# Patient Record
Sex: Male | Born: 1985 | Race: White | Hispanic: No | Marital: Married | Smoking: Never smoker
Health system: Southern US, Community
[De-identification: ages and names within clinical notes are randomized; demographics above are authoritative.]

## PROBLEM LIST (undated history)

## (undated) DIAGNOSIS — I739 Peripheral vascular disease, unspecified: Secondary | ICD-10-CM

## (undated) DIAGNOSIS — F341 Dysthymic disorder: Secondary | ICD-10-CM

## (undated) DIAGNOSIS — F419 Anxiety disorder, unspecified: Secondary | ICD-10-CM

## (undated) DIAGNOSIS — F909 Attention-deficit hyperactivity disorder, unspecified type: Secondary | ICD-10-CM

## (undated) DIAGNOSIS — K219 Gastro-esophageal reflux disease without esophagitis: Secondary | ICD-10-CM

## (undated) DIAGNOSIS — I10 Essential (primary) hypertension: Secondary | ICD-10-CM

## (undated) DIAGNOSIS — G4730 Sleep apnea, unspecified: Secondary | ICD-10-CM

## (undated) DIAGNOSIS — E119 Type 2 diabetes mellitus without complications: Secondary | ICD-10-CM

## (undated) DIAGNOSIS — I499 Cardiac arrhythmia, unspecified: Secondary | ICD-10-CM

## (undated) HISTORY — DX: Dysthymic disorder: F34.1

## (undated) HISTORY — PX: WISDOM TOOTH EXTRACTION: SHX21

## (undated) HISTORY — DX: Gastro-esophageal reflux disease without esophagitis: K21.9

---

## 2004-05-30 ENCOUNTER — Emergency Department (HOSPITAL_COMMUNITY): Admission: EM | Admit: 2004-05-30 | Discharge: 2004-05-30 | Payer: Self-pay | Admitting: Family Medicine

## 2006-08-23 ENCOUNTER — Ambulatory Visit: Payer: Self-pay | Admitting: Family Medicine

## 2006-09-05 ENCOUNTER — Ambulatory Visit: Payer: Self-pay | Admitting: Family Medicine

## 2006-10-04 ENCOUNTER — Ambulatory Visit: Payer: Self-pay | Admitting: Family Medicine

## 2007-05-13 ENCOUNTER — Ambulatory Visit: Payer: Self-pay | Admitting: Family Medicine

## 2007-05-22 ENCOUNTER — Ambulatory Visit: Payer: Self-pay | Admitting: Family Medicine

## 2007-05-26 ENCOUNTER — Ambulatory Visit: Payer: Self-pay | Admitting: Family Medicine

## 2007-12-31 ENCOUNTER — Ambulatory Visit: Payer: Self-pay | Admitting: Family Medicine

## 2008-04-23 ENCOUNTER — Ambulatory Visit: Payer: Self-pay | Admitting: Family Medicine

## 2008-10-22 ENCOUNTER — Ambulatory Visit: Payer: Self-pay | Admitting: Family Medicine

## 2008-12-31 ENCOUNTER — Ambulatory Visit: Payer: Self-pay | Admitting: Family Medicine

## 2009-02-22 ENCOUNTER — Ambulatory Visit: Payer: Self-pay | Admitting: Family Medicine

## 2009-04-29 ENCOUNTER — Ambulatory Visit: Payer: Self-pay | Admitting: Family Medicine

## 2009-06-03 ENCOUNTER — Ambulatory Visit: Payer: Self-pay | Admitting: Family Medicine

## 2009-11-09 ENCOUNTER — Encounter: Admission: RE | Admit: 2009-11-09 | Discharge: 2009-11-09 | Payer: Self-pay | Admitting: Family Medicine

## 2009-11-09 ENCOUNTER — Ambulatory Visit: Payer: Self-pay | Admitting: Family Medicine

## 2010-05-14 ENCOUNTER — Emergency Department (HOSPITAL_COMMUNITY): Admission: EM | Admit: 2010-05-14 | Discharge: 2010-05-15 | Payer: Self-pay | Admitting: Emergency Medicine

## 2010-05-15 ENCOUNTER — Ambulatory Visit: Payer: Self-pay | Admitting: Family Medicine

## 2011-03-08 ENCOUNTER — Emergency Department (HOSPITAL_COMMUNITY)
Admission: EM | Admit: 2011-03-08 | Discharge: 2011-03-08 | Disposition: A | Discharge status: Home / Self Care | Payer: Managed Care, Other (non HMO) | Attending: Emergency Medicine | Admitting: Emergency Medicine

## 2011-03-08 DIAGNOSIS — I10 Essential (primary) hypertension: Secondary | ICD-10-CM | POA: Insufficient documentation

## 2011-03-08 DIAGNOSIS — H6690 Otitis media, unspecified, unspecified ear: Secondary | ICD-10-CM | POA: Insufficient documentation

## 2011-03-09 ENCOUNTER — Ambulatory Visit (INDEPENDENT_AMBULATORY_CARE_PROVIDER_SITE_OTHER): Payer: Managed Care, Other (non HMO) | Admitting: Family Medicine

## 2011-03-09 DIAGNOSIS — H6690 Otitis media, unspecified, unspecified ear: Secondary | ICD-10-CM

## 2011-05-11 ENCOUNTER — Telehealth: Payer: Self-pay | Admitting: Family Medicine

## 2011-05-11 ENCOUNTER — Other Ambulatory Visit: Payer: Self-pay | Admitting: Family Medicine

## 2011-05-11 NOTE — Telephone Encounter (Signed)
No samples available 

## 2011-05-11 NOTE — Telephone Encounter (Signed)
jcl said we have no samples had to leave a message on phone of this

## 2011-05-11 NOTE — Telephone Encounter (Signed)
Called [facility] [address] advair 250/50 1 bid #1 with 6 rf

## 2011-06-10 ENCOUNTER — Other Ambulatory Visit: Payer: Self-pay | Admitting: Family Medicine

## 2011-06-10 MED ORDER — ALBUTEROL SULFATE HFA 108 (90 BASE) MCG/ACT IN AERS: 2.0000 | INHALATION_SPRAY | Freq: Four times a day (QID) | RESPIRATORY_TRACT | Status: DC | PRN

## 2011-06-10 NOTE — Telephone Encounter (Signed)
[facility], saying that [facility] was at pharmacy, stating he was having an asthma attack and requesting a refill on his ventolin inhaler.  Pharmacist stated that it didn't appear that patient was in any respiratory distress, and that last refill of Ventolin was in November.  I authorized refill x 1, and told him to advise pt to schedule appointment if breathing problems persist

## 2011-08-13 ENCOUNTER — Ambulatory Visit (INDEPENDENT_AMBULATORY_CARE_PROVIDER_SITE_OTHER): Payer: 59 | Admitting: Family Medicine

## 2011-08-13 ENCOUNTER — Encounter: Payer: Self-pay | Admitting: Family Medicine

## 2011-08-13 VITALS — BP 118/76 | HR 80 | Temp 98.2°F | Ht 72.0 in | Wt 188.0 lb

## 2011-08-13 DIAGNOSIS — K529 Noninfective gastroenteritis and colitis, unspecified: Secondary | ICD-10-CM

## 2011-08-13 DIAGNOSIS — R112 Nausea with vomiting, unspecified: Secondary | ICD-10-CM

## 2011-08-13 DIAGNOSIS — K5289 Other specified noninfective gastroenteritis and colitis: Secondary | ICD-10-CM

## 2011-08-13 MED ORDER — ONDANSETRON 8 MG PO TBDP: 8.0000 mg | ORAL_TABLET | Freq: Three times a day (TID) | ORAL | Status: AC | PRN

## 2011-08-13 NOTE — Patient Instructions (Signed)
Drink fluids--but do it SLOWLY Imodium if needed for diarrhea Avoid dairy Bland foods Return immediately for medical evaluation if high fevers, worsening pain, blood in vomit or stool, or other new concerns

## 2011-08-13 NOTE — Progress Notes (Signed)
Patient presents with complaint of nausea and vomiting since this am, has vomited 4-5 times since this am. Sweats and diarrhea.   Slight headache when he went to bad last night, otherwise felt fine yesterday.  Woke today with stomach doing flips, dry heaving and vomiting.  Still went to work.  Took one of his fiancee's orally disintegrating nausea med (likely zofran) and 3-4 hours later started vomiting again. Left work at 11:45, sipped on some gatorade (drank bottle in 20 minutes, and vomited it all up 15 minutes later).  +sweats at work, but felt cold.  Had been slightly constipated over the last day or so, but since arriving to this office, had an episode of diarrhea, loose and watery.  Nonbloody.  No hematesis.  Steffanie Rainwater had some nausea and vomiting 2 weeks ago--unclear if virus or related to some bladder medications.  No fevers, but some diarrhea, lasted 48 hours.  Has another friend with nausea/vomiting. Has kept down bottled water this afternoon.  No recent ABX; no camping or traveling, undercooked foods  Past Medical History  Diagnosis Date  . Asthma   . Allergic rhinitis   . GERD (gastroesophageal reflux disease)   . Dysthymia    Past Surgical History  Procedure Date  . Wisdom tooth extraction    History   Social History  . Marital Status: Single    Spouse Name: N/A    Number of Children: N/A  . Years of Education: N/A   Occupational History  . Curator    Social History Main Topics  . Smoking status: Never Smoker   . Smokeless tobacco: Never Used  . Alcohol Use: Yes     6 beers per month  . Drug Use: No  . Sexually Active: Not on file   Other Topics Concern  . Not on file   Social History Narrative   Engaged   ROS:  Denies fever, URI symptoms, cough, SOB, skin rash or other concerns. See HPI  PHYSICAL EXAM: BP 118/76  Pulse 80  Temp(Src) 98.2 F (36.8 C) (Oral)  Ht 6' (1.829 m)  Wt 188 lb (85.276 kg)  BMI 25.50 kg/m2 Well developed male, in no  distress HEENT: Sclera anicteric.  OP--moist mucus membranes Neck: no lymphadenopathy, or mass Heart: regular rate and rhythm without murmur Lungs: clear bilaterally Abdomen:  Normal bowel sounds.  Very mild epigastric and RUQ tenderness. No rebound or guarding.  No organomegaly or mass Extremities: no edema Skin: no rash, normal turgor  ASSESSMENT/PLAN: 1. Nausea & vomiting  ondansetron (ZOFRAN ODT) 8 MG disintegrating tablet  2. Gastroenteritis     Risks/side effects of zofran reviewed.  Gentle hydration.  Clear diet, advance as tolerated.  Avoid dairy given recent onset of diarrhea.  May use imodium prn diarrhea  Discussed signs and symptoms of dehydration--to go to ER for IV hydration if this develops.  Also to ER if worsening pain, fever, hematemesis, bloody stools, or other worsening symptoms

## 2011-12-24 ENCOUNTER — Encounter: Payer: Self-pay | Admitting: Medical

## 2011-12-24 ENCOUNTER — Ambulatory Visit (INDEPENDENT_AMBULATORY_CARE_PROVIDER_SITE_OTHER): Payer: 59 | Admitting: Medical

## 2011-12-24 VITALS — BP 120/90 | HR 68 | Temp 97.7°F | Resp 12 | Ht 72.0 in | Wt 199.0 lb

## 2011-12-24 DIAGNOSIS — J45909 Unspecified asthma, uncomplicated: Secondary | ICD-10-CM

## 2011-12-24 DIAGNOSIS — K219 Gastro-esophageal reflux disease without esophagitis: Secondary | ICD-10-CM

## 2011-12-24 MED ORDER — LEVOFLOXACIN 500 MG PO TABS: 500.0000 mg | ORAL_TABLET | Freq: Every day | ORAL | Status: DC

## 2011-12-24 MED ORDER — ALBUTEROL SULFATE HFA 108 (90 BASE) MCG/ACT IN AERS: 2.0000 | INHALATION_SPRAY | Freq: Four times a day (QID) | RESPIRATORY_TRACT | Status: DC | PRN

## 2011-12-24 MED ORDER — LANSOPRAZOLE 30 MG PO CPDR: DELAYED_RELEASE_CAPSULE | ORAL | Status: DC

## 2011-12-24 NOTE — Patient Instructions (Signed)
Gastroesophageal Reflux Disease, Adult Gastroesophageal reflux disease (GERD) happens when acid from your stomach flows up into the esophagus. When acid comes in contact with the esophagus, the acid causes soreness (inflammation) in the esophagus. Over time, GERD may create small holes (ulcers) in the lining of the esophagus. CAUSES   Increased body weight. This puts pressure on the stomach, making acid rise from the stomach into the esophagus.   Smoking. This increases acid production in the stomach.   Drinking alcohol. This causes decreased pressure in the lower esophageal sphincter (valve or ring of muscle between the esophagus and stomach), allowing acid from the stomach into the esophagus.   Late evening meals and a full stomach. This increases pressure and acid production in the stomach.   A malformed lower esophageal sphincter.  Sometimes, no cause is found. SYMPTOMS   Burning pain in the lower part of the mid-chest behind the breastbone and in the mid-stomach area. This may occur twice a week or more often.   Trouble swallowing.   Sore throat.   Dry cough.   Asthma-like symptoms including chest tightness, shortness of breath, or wheezing.  DIAGNOSIS  Your caregiver may be able to diagnose GERD based on your symptoms. In some cases, X-rays and other tests may be done to check for complications or to check the condition of your stomach and esophagus. TREATMENT  Your caregiver may recommend over-the-counter or prescription medicines to help decrease acid production. Ask your caregiver before starting or adding any new medicines.  HOME CARE INSTRUCTIONS   Change the factors that you can control. Ask your caregiver for guidance concerning weight loss, quitting smoking, and alcohol consumption.   Avoid foods and drinks that make your symptoms worse, such as:   Caffeine or alcoholic drinks.   Chocolate.   Peppermint or mint flavorings.   Garlic and onions.   Spicy foods.     Citrus fruits, such as oranges, lemons, or limes.   Tomato-based foods such as sauce, chili, salsa, and pizza.   Fried and fatty foods.   Avoid lying down for the 3 hours prior to your bedtime or prior to taking a nap.   Eat small, frequent meals instead of large meals.   Wear loose-fitting clothing. Do not wear anything tight around your waist that causes pressure on your stomach.   Raise the head of your bed 6 to 8 inches with wood blocks to help you sleep. Extra pillows will not help.   Only take over-the-counter or prescription medicines for pain, discomfort, or fever as directed by your caregiver.   Do not take aspirin, ibuprofen, or other nonsteroidal anti-inflammatory drugs (NSAIDs).  SEEK IMMEDIATE MEDICAL CARE IF:   You have pain in your arms, neck, jaw, teeth, or back.   Your pain increases or changes in intensity or duration.   You develop nausea, vomiting, or sweating (diaphoresis).   You develop shortness of breath, or you faint.   Your vomit is green, yellow, black, or looks like coffee grounds or blood.   Your stool is red, bloody, or black.  These symptoms could be signs of other problems, such as heart disease, gastric bleeding, or esophageal bleeding. MAKE SURE YOU:   Understand these instructions.   Will watch your condition.   Will get help right away if you are not doing well or get worse.  Document Released: 09/19/2005 Document Revised: 08/22/2011 Document Reviewed: 06/29/2011 [facility] Patient Information 2012 [place], [place].    Acute Bronchitis Bronchitis is a problem  of the air tubes leading to your lungs. Acute means the illness started quickly. In this condition, the lining of those tubes becomes puffy (swollen) and can leak fluid. This makes it harder for air to get in and out of your lungs. You may cough a lot. This is because the air tubes are narrow. Bronchitis is most often caused by a virus. Medicines that kill germs (antibiotics) may be  needed with germ (bacteria) infections for people who:  Smoke.   Have lasting (chronic) lung problems.   Are elderly.  HOME CARE  Rest.   Drink enough water and fluids to keep the pee clear or pale yellow.   Only take medicine as told by your doctor.   Medicines may be prescribed that will open up the airways. This will help make breathing easier.   Bronchitis usually gets better on its own in a few days.  Recovery from some problems (symptoms) of bronchitis may be slow. You should start feeling a little better after 2 to 3 days. Coughing may last for 3 to 4 weeks. GET HELP RIGHT AWAY IF:  You or your child has a temperature by mouth above 101, not controlled by medicine.   Chills or chest pain develops.   You or your child develops very bad shortness of breath.   There is bloody saliva mixed with mucus (sputum).   You or your child throws up (vomits) often, loses too much fluid (dehydration), feels faint, or has a very bad headache.   You or your child does not improve after 1 week of treatment.  MAKE SURE YOU:   Understand these instructions.   Will watch this condition.   Will get help right away if you or your child is not doing well or gets worse.  Document Released: 05/28/2008 Document Re-Released: 03/06/2010 [facility] Patient Information 2011 [place], [place].

## 2011-12-24 NOTE — Progress Notes (Signed)
Subjective:  Here today for multiple c/o.   He notes random bad coughing spells that last over a minute.  Starts as heart burn, gets tickle in back of throat, and then goes to coughing.  Within 15 minutes of eating, gets these bad coughing spells also, and will usually vomit up his last meal.  Lately been having constant heartburn.  Voice is raspy.  Diagnosed with GERD years ago by ENT, was put on Nexium at that time.  At some point ran out of the medication.   Over time though he has used OTC Prilosec with relief.  Last few weeks been using Prilosec and this has helped.  Lately getting coughing spells first thing in the morning. Father has GERD.  PGM with intestinal cancer but no other GI cancers in family.  Thinks he has a respiratory infection on top of the cough issues.  He notes sinus pressure, mucous from nose and productive sputum x 5 days.  Feels rattly in chest.  Denies fever, ear pain, but has had some sore throat.  He is a nonsmoker.   Past Medical History  Diagnosis Date  . Asthma   . Allergic rhinitis   . GERD (gastroesophageal reflux disease)   . Dysthymia       Objective:   Physical Exam  Filed Vitals:   12/24/11 0823  BP: 120/90  Pulse: 68  Temp: 97.7 F (36.5 C)    General appearance: alert, no distress, WD/WN, white male HEENT: normocephalic, sclerae anicteric, TMs pearly, nares with clear discharge mild erythema, pharynx normal Oral cavity: MMM, no lesions Neck: supple, no lymphadenopathy, no thyromegaly, no masses Heart: RRR, normal S1, S2, no murmurs Lungs: CTA bilaterally, no wheezes, rhonchi, or rales Abdomen: +bs, soft, non tender, non distended, no masses, no hepatomegaly, no splenomegaly Pulses: 2+ symmetric, upper and lower extremities, normal cap refill   Assessment and Plan :    Encounter Diagnoses  Name Primary?  Marland Kitchen Asthmatic bronchitis Yes  . GERD (gastroesophageal reflux disease)    Asthmatic bronchitis-prescription today for Levaquin,  hydrate well, can use over-the-counter cough and decongestant medication  GERD- stop omeprazole, begin Prevacid. Advised he avoid reflux triggers, recheck in one month.

## 2012-01-02 ENCOUNTER — Other Ambulatory Visit: Payer: Self-pay | Admitting: Family Medicine

## 2012-01-07 ENCOUNTER — Telehealth: Payer: Self-pay | Admitting: Medical

## 2012-01-07 NOTE — Telephone Encounter (Signed)
OV recheck to discuss options and other testing

## 2012-01-10 ENCOUNTER — Ambulatory Visit (INDEPENDENT_AMBULATORY_CARE_PROVIDER_SITE_OTHER): Payer: 59 | Admitting: Family Medicine

## 2012-01-10 VITALS — BP 120/80 | HR 85 | Temp 98.2°F | Ht 72.0 in | Wt 193.0 lb

## 2012-01-10 DIAGNOSIS — J209 Acute bronchitis, unspecified: Secondary | ICD-10-CM

## 2012-01-10 MED ORDER — LEVOFLOXACIN 500 MG PO TABS: 500.0000 mg | ORAL_TABLET | Freq: Every day | ORAL | Status: AC

## 2012-01-10 NOTE — Patient Instructions (Signed)
Use Robitussin-DM during the day and NyQuil at night. If you're not totally back to normal when he finish the antibiotics, call me.

## 2012-01-10 NOTE — Progress Notes (Signed)
  Subjective:    Patient ID: Ronald Anderson, male    DOB: 1986-11-13, 26 y.o.   MRN: [id number]  HPI He is here for recheck. He does state he got roughly 75% better after the last round of antibiotics but the productive cough is starting to get worse. He has had no fever, sore throat or earache.   Review of Systems     Objective:   Physical Exam alert and in no distress. Tympanic membranes and canals are normal. Throat is clear. Tonsils are normal. Neck is supple without adenopathy or thyromegaly. Cardiac exam shows a regular sinus rhythm without murmurs or gallops. Lungs are clear to auscultation.        Assessment & Plan:   1. Bronchitis, acute    I will give him Levaquin again. He is to call me at the end of the course of antibiotic if not entirely better.

## 2012-03-31 ENCOUNTER — Telehealth: Payer: Self-pay | Admitting: Internal Medicine

## 2012-03-31 MED ORDER — ALBUTEROL SULFATE HFA 108 (90 BASE) MCG/ACT IN AERS: 2.0000 | INHALATION_SPRAY | Freq: Four times a day (QID) | RESPIRATORY_TRACT | Status: DC | PRN

## 2012-03-31 NOTE — Telephone Encounter (Signed)
Albuterol renewed

## 2012-07-06 ENCOUNTER — Other Ambulatory Visit: Payer: Self-pay | Admitting: Family Medicine

## 2012-09-26 ENCOUNTER — Other Ambulatory Visit: Payer: Self-pay | Admitting: Family Medicine

## 2013-01-27 ENCOUNTER — Other Ambulatory Visit: Payer: Self-pay | Admitting: Family Medicine

## 2013-03-09 ENCOUNTER — Telehealth: Payer: Self-pay | Admitting: Family Medicine

## 2013-03-09 NOTE — Telephone Encounter (Signed)
PT CALLED REQUESTING REFILL ON ADVAIR SENT INTO [facility] [address]

## 2013-03-09 NOTE — Telephone Encounter (Signed)
PT INFORMED 01/27/13 PT HAD MED SENT IN WITH 5 REFILLS

## 2013-04-13 ENCOUNTER — Other Ambulatory Visit: Payer: Self-pay | Admitting: Family Medicine

## 2013-07-23 ENCOUNTER — Other Ambulatory Visit: Payer: Self-pay | Admitting: Family Medicine

## 2013-08-11 ENCOUNTER — Encounter: Payer: Self-pay | Admitting: Family Medicine

## 2013-08-11 ENCOUNTER — Ambulatory Visit (INDEPENDENT_AMBULATORY_CARE_PROVIDER_SITE_OTHER): Payer: 59 | Admitting: Family Medicine

## 2013-08-11 VITALS — BP 114/70 | HR 90 | Temp 97.8°F | Wt 204.0 lb

## 2013-08-11 DIAGNOSIS — J209 Acute bronchitis, unspecified: Secondary | ICD-10-CM

## 2013-08-11 DIAGNOSIS — J45909 Unspecified asthma, uncomplicated: Secondary | ICD-10-CM

## 2013-08-11 DIAGNOSIS — J019 Acute sinusitis, unspecified: Secondary | ICD-10-CM

## 2013-08-11 MED ORDER — ALBUTEROL SULFATE HFA 108 (90 BASE) MCG/ACT IN AERS: INHALATION_SPRAY | RESPIRATORY_TRACT | Status: DC

## 2013-08-11 MED ORDER — FLUTICASONE-SALMETEROL 250-50 MCG/DOSE IN AEPB: INHALATION_SPRAY | RESPIRATORY_TRACT | Status: DC

## 2013-08-11 MED ORDER — CLARITHROMYCIN 500 MG PO TABS: 500.0000 mg | ORAL_TABLET | Freq: Two times a day (BID) | ORAL | Status: DC

## 2013-08-11 NOTE — Progress Notes (Signed)
  Subjective:    Patient ID: Ronald Anderson, male    DOB: [birth year], 27 y.o.   MRN: [id number]  HPI A three-week history this started with nasal congestion, PND area of the drainage became purulent after 3 or 4 days. Approximately 10 days ago he then developed a cough that has now become productive. No fever, chills, sore throat or earache. He does not smoke and does have spring and fall allergies and uses OTC meds for this. He does have asthma and needs a refill on his medications.  Review of Systems     Objective:   Physical Exam alert and in no distress. Tympanic membranes and canals are normal. Throat is clear. Tonsils are normal. Neck is supple without adenopathy or thyromegaly. Cardiac exam shows a regular sinus rhythm without murmurs or gallops. Lungs are clear to auscultation.        Assessment & Plan:  Acute bronchitis - Plan: clarithromycin (BIAXIN) 500 MG tablet  Acute sinusitis - Plan: clarithromycin (BIAXIN) 500 MG tablet  Asthma - Plan: Fluticasone-Salmeterol (ADVAIR DISKUS) 250-50 MCG/DOSE AEPB, albuterol (VENTOLIN HFA) 108 (90 BASE) MCG/ACT inhaler

## 2013-08-17 ENCOUNTER — Other Ambulatory Visit: Payer: Self-pay | Admitting: Medical

## 2013-08-17 ENCOUNTER — Telehealth: Payer: Self-pay | Admitting: Internal Medicine

## 2013-08-17 MED ORDER — LEVOFLOXACIN 500 MG PO TABS: 500.0000 mg | ORAL_TABLET | Freq: Every day | ORAL | Status: DC

## 2013-08-17 NOTE — Telephone Encounter (Signed)
zpak not much different than Biaxin.  If not improving, I suggest trial of Levaquin.   rx sent.

## 2013-08-17 NOTE — Telephone Encounter (Signed)
Pt would like another antibotic which he uses like a z-pac and also would like something for his cough.

## 2013-08-17 NOTE — Telephone Encounter (Signed)
Dr. Susann Givens tried to called back last night and was unable to get in touch with him but it was something with a medicine.  I just spoke with pt and pt states that he is on day 6 of Biaxin and it is not helping at all. He states he is coughing so much he is throwing up, he feels like he has strained the muscles in his neck cause he is coughing so much and that he still has a lot of chest congestion. If you send something else in [facility] to [facility] and call and let pt know

## 2013-08-18 NOTE — Telephone Encounter (Signed)
Patient is aware of the medication that was sent to the pharmacy. CLS 

## 2013-11-03 ENCOUNTER — Other Ambulatory Visit: Payer: Self-pay

## 2013-11-03 ENCOUNTER — Telehealth: Payer: Self-pay | Admitting: Family Medicine

## 2013-11-03 MED ORDER — OMEPRAZOLE MAGNESIUM 20 MG PO TBEC: 20.0000 mg | DELAYED_RELEASE_TABLET | Freq: Two times a day (BID) | ORAL | Status: AC

## 2013-11-03 NOTE — Telephone Encounter (Signed)
Pt is requesting refill on Omeprazole 20 mg... He forgot to ask for more refills last visit, and has not taken it in about a year now, but is needing it again.  [facility] [address]. Please notify pt when ready.

## 2013-11-03 NOTE — Telephone Encounter (Signed)
SENT OMEPRA. IN PER PT REQUEST

## 2014-01-05 ENCOUNTER — Ambulatory Visit (INDEPENDENT_AMBULATORY_CARE_PROVIDER_SITE_OTHER): Payer: 59 | Admitting: Family Medicine

## 2014-01-05 ENCOUNTER — Encounter: Payer: Self-pay | Admitting: Family Medicine

## 2014-01-05 VITALS — BP 132/88 | HR 106 | Wt 202.0 lb

## 2014-01-05 DIAGNOSIS — J45909 Unspecified asthma, uncomplicated: Secondary | ICD-10-CM

## 2014-01-05 NOTE — Progress Notes (Signed)
   Subjective:    Patient ID: Ronald BeckmannJames R Anderson, male    DOB: 02/13/1986, 28 y.o.   MRN: [id number]  HPI Approximately one week ago he noted the onset of wheezing. He has had difficulty with this in the past. The coughing got much worse and he also noted increase in his wheezing. He is on Advair and has been using it regularly as well as an albuterol rescue inhaler. This helped break his wheezing and coughing.No fever, chills, sore throat or earache. He was seen yesterday in an emergency room and given azithromycin, prednisone and albuterol inhaler. He was given breathing treatments in the ER. He states that his pulse ox in the ER was 87. When left it was 97. He does not smoke.   Review of Systems     Objective:   Physical Exam Alert and in no distress. Lungs are clear to auscultation. Cardiac exam shows regular rhythm without murmurs or gallops.       Assessment & Plan:  Asthma  he is to stay on his Advair and use albuterol as a rescue inhaler. I will also have him fill a prescription for the prednisone. Otherwise we will monitor the situation. She continues to have difficulty, I will increase his Advair to 50/500. I discussed parameters concerning calling me. Discussed the fact that his trip to the emergency room was entirely appropriate but I would like to try and help keep him from needing to go to the ER.

## 2014-01-07 ENCOUNTER — Other Ambulatory Visit: Payer: Self-pay | Admitting: Family Medicine

## 2014-01-07 DIAGNOSIS — J45909 Unspecified asthma, uncomplicated: Secondary | ICD-10-CM

## 2014-01-07 MED ORDER — ALBUTEROL SULFATE HFA 108 (90 BASE) MCG/ACT IN AERS: INHALATION_SPRAY | RESPIRATORY_TRACT | Status: DC

## 2014-01-08 ENCOUNTER — Telehealth: Payer: Self-pay | Admitting: Family Medicine

## 2014-01-08 MED ORDER — AZITHROMYCIN 500 MG PO TABS: 500.0000 mg | ORAL_TABLET | Freq: Every day | ORAL | Status: DC

## 2014-01-08 NOTE — Telephone Encounter (Signed)
He is now starting to have a productive cough. This might explain why he is having some more difficulty with his asthma. I will call in azithromycin and have him continue on his asthma medications.

## 2014-01-14 ENCOUNTER — Telehealth: Payer: Self-pay | Admitting: Family Medicine

## 2014-01-14 MED ORDER — LEVOFLOXACIN 500 MG PO TABS: 500.0000 mg | ORAL_TABLET | Freq: Every day | ORAL | Status: DC

## 2014-01-14 NOTE — Telephone Encounter (Signed)
Let him know that I called the medication in. 

## 2014-01-14 NOTE — Telephone Encounter (Signed)
Pt called and states he has finished his meds and the cough, bronchitis has come back with a vengeance.  He is requesting another rx.  He thinks before when he had this that you gave him Levaquin that knocked this out.  [facility] [address]  Pt phone# [phone]

## 2014-01-14 NOTE — Telephone Encounter (Signed)
Pt aware.

## 2014-01-16 ENCOUNTER — Other Ambulatory Visit: Payer: Self-pay | Admitting: Family Medicine

## 2014-01-16 MED ORDER — CEFUROXIME AXETIL 250 MG PO TABS: 250.0000 mg | ORAL_TABLET | Freq: Two times a day (BID) | ORAL | Status: DC

## 2014-01-26 ENCOUNTER — Telehealth: Payer: Self-pay | Admitting: Family Medicine

## 2014-01-26 MED ORDER — CEFUROXIME AXETIL 250 MG PO TABS: 250.0000 mg | ORAL_TABLET | Freq: Two times a day (BID) | ORAL | Status: DC

## 2014-01-26 NOTE — Telephone Encounter (Signed)
Let him know that I called another round of the antibiotic and for him. Have him use NyQuil at night and let me know how he does when he finishes this course.

## 2014-01-26 NOTE — Telephone Encounter (Signed)
Pt called and stated that he us much better but still coughing a lot at night. He states that the green mucus has cleared up and he feels much butter but cant get rid of the cough. Please advise pt on what he needs to do about lingering cough. [facility] [address]

## 2014-01-26 NOTE — Telephone Encounter (Signed)
Pt aware.

## 2014-03-15 ENCOUNTER — Ambulatory Visit: Payer: 59 | Admitting: Family Medicine

## 2014-04-19 ENCOUNTER — Encounter: Payer: Self-pay | Admitting: Family Medicine

## 2014-11-02 ENCOUNTER — Ambulatory Visit (INDEPENDENT_AMBULATORY_CARE_PROVIDER_SITE_OTHER): Payer: 59 | Admitting: Family Medicine

## 2014-11-02 ENCOUNTER — Encounter: Payer: Self-pay | Admitting: Family Medicine

## 2014-11-02 VITALS — BP 138/100 | HR 79 | Wt 193.0 lb

## 2014-11-02 DIAGNOSIS — R030 Elevated blood-pressure reading, without diagnosis of hypertension: Secondary | ICD-10-CM

## 2014-11-02 DIAGNOSIS — IMO0001 Reserved for inherently not codable concepts without codable children: Secondary | ICD-10-CM

## 2014-11-02 NOTE — Progress Notes (Signed)
   Subjective:    Patient ID: Ronald Anderson, male    DOB: 08/15/1986, 28 y.o.   MRN: [id number]  HPI He is here for consult concerning elevated blood pressure. He was evaluated for new work position and found to have a blood pressure of 148/102. There is also history of him having elevated pressure with an admission back in January but he does not remember the numbers. He is here today for follow-up on this.   Review of Systems     Objective:   Physical Exam Alert and in no distress. Blood pressure is recorded.       Assessment & Plan:  Elevated blood pressure I discussed treatment of elevated blood pressure in regard to diet, medications, alcohol, smoking. He will return here in one month for recheck and if still elevated, I will place him on medication. He is comfortable with this approach.

## 2014-12-07 ENCOUNTER — Ambulatory Visit (INDEPENDENT_AMBULATORY_CARE_PROVIDER_SITE_OTHER): Payer: 59 | Admitting: Family Medicine

## 2014-12-07 ENCOUNTER — Encounter: Payer: Self-pay | Admitting: Family Medicine

## 2014-12-07 VITALS — BP 130/80 | HR 83 | Wt 190.0 lb

## 2014-12-07 DIAGNOSIS — J209 Acute bronchitis, unspecified: Secondary | ICD-10-CM

## 2014-12-07 MED ORDER — LEVOFLOXACIN 500 MG PO TABS: 500.0000 mg | ORAL_TABLET | Freq: Every day | ORAL | Status: DC

## 2014-12-07 NOTE — Progress Notes (Signed)
   Subjective:    Patient ID: Ronald BeckmannJames R Anderson, male    DOB: 12/05/1986, 28 y.o.   MRN: [id number]  HPI He is here for recheck on his blood pressure. He also states that he was recently seen and treated with Levaquin for a bronchitis but still is having difficulty with cough and occasional wheezing. He does have a rescue inhaler at home.   Review of Systems     Objective:   Physical Exam Alert and in no distress. Lungs are clear to auscultation. Cardiac exam shows regular rhythm without murmurs or gallops. She is recorded        Assessment & Plan:  Acute bronchitis, unspecified organism - Plan: levofloxacin (LEVAQUIN) 500 MG tablet  I will add Levaquin back to his regimen for a week to see if this will help with his coughing. Discussed his blood pressure and recommend he keep track of this every several months. He is comfortable with this approach.

## 2017-04-17 ENCOUNTER — Ambulatory Visit (INDEPENDENT_AMBULATORY_CARE_PROVIDER_SITE_OTHER): Payer: Self-pay | Admitting: Family Medicine

## 2017-04-17 ENCOUNTER — Encounter: Payer: Self-pay | Admitting: Family Medicine

## 2017-04-17 VITALS — BP 128/88 | HR 110 | Temp 98.3°F | Wt 199.0 lb

## 2017-04-17 DIAGNOSIS — J4530 Mild persistent asthma, uncomplicated: Secondary | ICD-10-CM

## 2017-04-17 MED ORDER — CLARITHROMYCIN 500 MG PO TABS: 500.0000 mg | ORAL_TABLET | Freq: Two times a day (BID) | ORAL | 0 refills | Status: DC

## 2017-04-17 NOTE — Progress Notes (Signed)
   Subjective:    Patient ID: Ronald Anderson, male    DOB: 1986/04/26, 31 y.o.   MRN: [id number]  HPI One week ago he noted the onset of intermittent cough and then 3 days ago he will cope with sore throat, sinus Pressure, purulent nasal and postnasal drainage but no fever, chills, earache. He does have underlying allergies and is using Advair. He also recently used Aleve daily. He does not smoke. Doesn't he is in between insurance coverage.   Review of Systems     Objective:   Physical Exam Alert and in no distress. Nasal mucosa is normal with no tenderness over sinuses Tympanic membranes and canals are normal. Pharyngeal area is normal. Neck is supple without adenopathy or thyromegaly. Cardiac exam shows a regular sinus rhythm without murmurs or gallops. Lungs show some expiratory wheezing.        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 He is to continue on his asthma meds and use albuterol as needed. Informed him to call if he is not entirely better when he finishes the antibiotic.

## 2017-05-31 ENCOUNTER — Ambulatory Visit (INDEPENDENT_AMBULATORY_CARE_PROVIDER_SITE_OTHER): Payer: Self-pay | Admitting: Family Medicine

## 2017-05-31 ENCOUNTER — Encounter: Payer: Self-pay | Admitting: Family Medicine

## 2017-05-31 VITALS — BP 120/80 | HR 83 | Wt 199.0 lb

## 2017-05-31 DIAGNOSIS — J4530 Mild persistent asthma, uncomplicated: Secondary | ICD-10-CM

## 2017-05-31 NOTE — Progress Notes (Signed)
   Subjective:    Patient ID: Ronald Anderson, male    DOB: 08/12/1986, 31 y.o.   MRN: [id number]  HPI He is in the process of applying for a new job and spirometry was done which was apparently abnormal. Presently he is on 250/50 of Advair which is lower than his normal dosing of 500/50. He was given samples which is why he was on that dosing. Presently he is having no chest pain, shortness of breath, wheezing. He needs another PFT.   Review of Systems     Objective:   Physical Exam Alert and in no distress otherwise not examined. Pulmonary function testing did show moderate airway disease.       Assessment & Plan:  Mild persistent asthma, unspecified whether complicated - Plan: Spirometry with graph  I gave him a copy of the graft and also a sample of 500/50. We will continue him on that dosing regimen.

## 2017-06-25 ENCOUNTER — Other Ambulatory Visit: Payer: Self-pay | Admitting: Family Medicine

## 2017-07-19 DIAGNOSIS — Z0100 Encounter for examination of eyes and vision without abnormal findings: Secondary | ICD-10-CM | POA: Diagnosis not present

## 2018-05-21 ENCOUNTER — Other Ambulatory Visit: Payer: Self-pay | Admitting: Family Medicine

## 2018-05-21 NOTE — Telephone Encounter (Signed)
[facility] is requesting to fill pt advair. Please advise KH 

## 2018-05-21 NOTE — Telephone Encounter (Signed)
[facility] is requesting

## 2018-05-23 ENCOUNTER — Other Ambulatory Visit: Payer: Self-pay

## 2018-05-23 ENCOUNTER — Encounter: Payer: Self-pay | Admitting: Family Medicine

## 2018-05-23 ENCOUNTER — Ambulatory Visit: Payer: 59 | Admitting: Family Medicine

## 2018-05-23 VITALS — BP 112/84 | HR 100 | Temp 98.7°F | Ht 72.0 in | Wt 188.4 lb

## 2018-05-23 DIAGNOSIS — J4530 Mild persistent asthma, uncomplicated: Secondary | ICD-10-CM

## 2018-05-23 DIAGNOSIS — J309 Allergic rhinitis, unspecified: Secondary | ICD-10-CM | POA: Diagnosis not present

## 2018-05-23 DIAGNOSIS — J0100 Acute maxillary sinusitis, unspecified: Secondary | ICD-10-CM | POA: Diagnosis not present

## 2018-05-23 DIAGNOSIS — G479 Sleep disorder, unspecified: Secondary | ICD-10-CM | POA: Diagnosis not present

## 2018-05-23 MED ORDER — FLUTICASONE-SALMETEROL 500-50 MCG/DOSE IN AEPB: 1.0000 | INHALATION_SPRAY | Freq: Two times a day (BID) | RESPIRATORY_TRACT | 3 refills | Status: DC

## 2018-05-23 MED ORDER — MONTELUKAST SODIUM 10 MG PO TABS: 10.0000 mg | ORAL_TABLET | Freq: Every day | ORAL | 3 refills | Status: DC

## 2018-05-23 MED ORDER — AZITHROMYCIN 500 MG PO TABS
500.0000 mg | ORAL_TABLET | Freq: Every day | ORAL | 0 refills | Status: DC
Start: 2018-05-23 — End: 2018-05-23

## 2018-05-23 MED ORDER — AZITHROMYCIN 500 MG PO TABS: 500.0000 mg | ORAL_TABLET | Freq: Every day | ORAL | 0 refills | Status: DC

## 2018-05-23 NOTE — Patient Instructions (Addendum)
If you have to use the rescue inhaler more than twice a week during the day or twice a month at night you are not under control Switch to Rhinocort from the Flonase Take the antibiotic but if 7 to 10 days down the road you are not back to normal call and I will give you another doseInsomnia Insomnia is a sleep disorder that makes it difficult to fall asleep or to stay asleep. Insomnia can cause tiredness (fatigue), low energy, difficulty concentrating, mood swings, and poor performance at work or school. There are three different ways to classify insomnia:  Difficulty falling asleep.  Difficulty staying asleep.  Waking up too early in the morning.  Any type of insomnia can be long-term (chronic) or short-term (acute). Both are common. Short-term insomnia usually lasts for three months or less. Chronic insomnia occurs at least three times a week for longer than three months. What are the causes? Insomnia may be caused by another condition, situation, or substance, such as:  Anxiety.  Certain medicines.  Gastroesophageal reflux disease (GERD) or other gastrointestinal conditions.  Asthma or other breathing conditions.  Restless legs syndrome, sleep apnea, or other sleep disorders.  Chronic pain.  Menopause. This may include hot flashes.  Stroke.  Abuse of alcohol, tobacco, or illegal drugs.  Depression.  Caffeine.  Neurological disorders, such as Alzheimer disease.  An overactive thyroid (hyperthyroidism).  The cause of insomnia may not be known. What increases the risk? Risk factors for insomnia include:  Gender. Women are more commonly affected than men.  Age. Insomnia is more common as you get older.  Stress. This may involve your professional or personal life.  Income. Insomnia is more common in people with lower income.  Lack of exercise.  Irregular work schedule or night shifts.  Traveling between different time zones.  What are the signs or  symptoms? If you have insomnia, trouble falling asleep or trouble staying asleep is the main symptom. This may lead to other symptoms, such as:  Feeling fatigued.  Feeling nervous about going to sleep.  Not feeling rested in the morning.  Having trouble concentrating.  Feeling irritable, anxious, or depressed.  How is this treated? Treatment for insomnia depends on the cause. If your insomnia is caused by an underlying condition, treatment will focus on addressing the condition. Treatment may also include:  Medicines to help you sleep.  Counseling or therapy.  Lifestyle adjustments.  Follow these instructions at home:  Take medicines only as directed by your health care provider.  Keep regular sleeping and waking hours. Avoid naps.  Keep a sleep diary to help you and your health care provider figure out what could be causing your insomnia. Include: ? When you sleep. ? When you wake up during the night. ? How well you sleep. ? How rested you feel the next day. ? Any side effects of medicines you are taking. ? What you eat and drink.  Make your bedroom a comfortable place where it is easy to fall asleep: ? Put up shades or special blackout curtains to block light from outside. ? Use a white noise machine to block noise. ? Keep the temperature cool.  Exercise regularly as directed by your health care provider. Avoid exercising right before bedtime.  Use relaxation techniques to manage stress. Ask your health care provider to suggest some techniques that may work well for you. These may include: ? Breathing exercises. ? Routines to release muscle tension. ? Visualizing peaceful scenes.  Cut back  on alcohol, caffeinated beverages, and cigarettes, especially close to bedtime. These can disrupt your sleep.  Do not overeat or eat spicy foods right before bedtime. This can lead to digestive discomfort that can make it hard for you to sleep.  Limit screen use before bedtime.  This includes: ? Watching TV. ? Using your smartphone, tablet, and computer.  Stick to a routine. This can help you fall asleep faster. Try to do a quiet activity, brush your teeth, and go to bed at the same time each night.  Get out of bed if you are still awake after 15 minutes of trying to sleep. Keep the lights down, but try reading or doing a quiet activity. When you feel sleepy, go back to bed.  Make sure that you drive carefully. Avoid driving if you feel very sleepy.  Keep all follow-up appointments as directed by your health care provider. This is important. Contact a health care provider if:  You are tired throughout the day or have trouble in your daily routine due to sleepiness.  You continue to have sleep problems or your sleep problems get worse. Get help right away if:  You have serious thoughts about hurting yourself or someone else. This information is not intended to replace advice given to you by your health care provider. Make sure you discuss any questions you have with your health care provider. Document Released: 12/07/2000 Document Revised: 05/11/2016 Document Reviewed: 09/10/2014 Elsevier Interactive Patient Education  [employer].

## 2018-05-23 NOTE — Progress Notes (Signed)
   Subjective:    Patient ID: Ronald BeckmannJames R Bagdasarian, male    DOB: 07/24/1986, 32 y.o.   MRN: [id number]  HPI He is here for consult concerning a week long history of increasing sinus pressure, fatigue, fever, purulent postnasal drainage.  Those symptoms have been going on for roughly a month but got worse in the last week.  He does have underlying allergies and asthma.  He is taking Advair and does use a rescue inhaler as needed.  Is also taking Singulair and is doing well on them until recently.  Flonase does occasionally cause nasal irritation.  He does not smoke. He also works 12-hour shifts that can be first or second shift.  This sometimes does interfere with his sleep pattern.  Review of Systems     Objective:   Physical Exam Alert and in no distress. Tympanic membranes and canals are normal. Pharyngeal area is normal. Neck is supple without adenopathy or thyromegaly. Cardiac exam shows a regular sinus rhythm without murmurs or gallops. Lungs are clear to auscultation. Nasal mucosa is red but no tenderness over sinuses       Assessment & Plan:  Mild persistent asthma, unspecified whether complicated - Plan: montelukast (SINGULAIR) 10 MG tablet, Fluticasone-Salmeterol (ADVAIR DISKUS) 500-50 MCG/DOSE AEPB  Allergic rhinitis, unspecified seasonality, unspecified trigger - Plan: montelukast (SINGULAIR) 10 MG tablet  Acute non-recurrent maxillary sinusitis - Plan: azithromycin (ZITHROMAX) 500 MG tablet  Sleep disturbance Reinforced the need for him to stay on his Advair and only use the rescue inhaler on an as-needed basis.  Instructions were given concerning that.  He will continue on his Singulair but I recommended trying Rhinocort.  He is to call me if not better after he finishes a first course of the Zithromax. I then discussed sleep and sleep hygiene looking information was given.  Discussed the possible use of the short-term sleep med but at this point we will pursue that.

## 2018-11-19 ENCOUNTER — Encounter: Payer: Self-pay | Admitting: Family Medicine

## 2018-12-05 ENCOUNTER — Ambulatory Visit: Payer: 59 | Admitting: Family Medicine

## 2018-12-05 ENCOUNTER — Encounter: Payer: Self-pay | Admitting: Family Medicine

## 2018-12-05 VITALS — BP 118/86 | HR 81 | Temp 97.7°F | Wt 194.4 lb

## 2018-12-05 DIAGNOSIS — R4586 Emotional lability: Secondary | ICD-10-CM | POA: Diagnosis not present

## 2018-12-05 DIAGNOSIS — Z8249 Family history of ischemic heart disease and other diseases of the circulatory system: Secondary | ICD-10-CM | POA: Diagnosis not present

## 2018-12-05 DIAGNOSIS — R030 Elevated blood-pressure reading, without diagnosis of hypertension: Secondary | ICD-10-CM | POA: Diagnosis not present

## 2018-12-05 NOTE — Progress Notes (Signed)
   Subjective:    Patient ID: Ronald BeckmannJames R Anderson, male    DOB: 02/21/1986, 32 y.o.   MRN: [id number]  HPI He is here for consult concerning multiple issues.  He has noted his blood pressure being slightly elevated over the last several months.  He has concerns about this.  He does have a family history of heart disease.  He states his father had a heart attack in his 2350s.  Also he has been involved in counseling with his wife.  He is seeing Christain SacramentoJane Lessard.  Apparently she thinks that he might do well on a medication.  He is having difficulty with irritability, mood swings, sleep disturbance.  Apparently his wife according to him is bipolar and is on medications.   Review of Systems     Objective:   Physical Exam Alert and in no distress otherwise not examined.  Blood pressure is recorded.       Assessment & Plan:  Elevated blood pressure reading  Family history of heart disease in male family member before age 34  Mood swings His blood pressure reading is elevated.  I discussed proper way to check his blood pressure.  Also discussed decongestants, caffeine, alcohol which apparently are not an issue.  Recommend that he look into the Dash diet to see if this will help.  Recheck his blood pressure in several months. Also discussed the fact that he will eventually need to be seen by cardiologist and come back here for complete examination. He is to have his therapist send me a note so she and I can compare ideas on what the best medication would be. Greater than 25 minutes, the entire time spent in counseling and coordination of care

## 2018-12-05 NOTE — Patient Instructions (Signed)
DASH Diet

## 2019-01-02 ENCOUNTER — Other Ambulatory Visit: Payer: Self-pay | Admitting: Family Medicine

## 2019-01-02 DIAGNOSIS — F419 Anxiety disorder, unspecified: Secondary | ICD-10-CM

## 2019-01-02 MED ORDER — FLUOXETINE HCL 20 MG PO TABS
20.0000 mg | ORAL_TABLET | Freq: Every day | ORAL | 3 refills | Status: DC
Start: 2019-01-02 — End: 2019-04-20

## 2019-01-02 NOTE — Progress Notes (Signed)
I received a phone call from Ronald Anderson who is working with him to help dealing with what she thinks is some OCD characteristics as well as anxiety.  Her recommendation would be to try Prozac which I have no problem with.  She will continue to work with him and his wife who apparently also has bipolar disorder.  She wants them both to be on adequate medications and then work on their marriage.  I will see him in 1 month.

## 2019-03-06 ENCOUNTER — Encounter: Payer: Self-pay | Admitting: Family Medicine

## 2019-03-06 ENCOUNTER — Other Ambulatory Visit: Payer: Self-pay

## 2019-03-06 ENCOUNTER — Ambulatory Visit: Payer: 59 | Admitting: Family Medicine

## 2019-03-06 VITALS — BP 120/70 | HR 76 | Temp 99.5°F | Resp 16 | Wt 184.8 lb

## 2019-03-06 DIAGNOSIS — J029 Acute pharyngitis, unspecified: Secondary | ICD-10-CM

## 2019-03-06 DIAGNOSIS — J111 Influenza due to unidentified influenza virus with other respiratory manifestations: Secondary | ICD-10-CM

## 2019-03-06 DIAGNOSIS — R509 Fever, unspecified: Secondary | ICD-10-CM

## 2019-03-06 LAB — POCT INFLUENZA A/B
Influenza A, POC: POSITIVE — AB
Influenza B, POC: POSITIVE — AB

## 2019-03-06 LAB — POCT RAPID STREP A (OFFICE): RAPID STREP A SCREEN: NEGATIVE

## 2019-03-06 MED ORDER — OSELTAMIVIR PHOSPHATE 75 MG PO CAPS: 75.0000 mg | ORAL_CAPSULE | Freq: Two times a day (BID) | ORAL | 0 refills | Status: DC

## 2019-03-06 NOTE — Patient Instructions (Signed)
You are positive for flu.  Take the Tamiflu as prescribed.  Take tylenol or ibuprofen and stay well hydrated.   Rest and avoid going out in public or being around people for the next 3-4 days.   Call Monday if you still have fever or if you are not feeling much improved.  If you get worse over the weekend, shortness of breath, high fever in spite of medication, then you may need to be seen again at the emergency department.    Influenza, Adult Influenza is also called "the flu." It is an infection in the lungs, nose, and throat (respiratory tract). It is caused by a virus. The flu causes symptoms that are similar to symptoms of a cold. It also causes a high fever and body aches. The flu spreads easily from person to person (is contagious). Getting a flu shot (influenza vaccination) every year is the best way to prevent the flu. What are the causes? This condition is caused by the influenza virus. You can get the virus by:  Breathing in droplets that are in the air from the cough or sneeze of a person who has the virus.  Touching something that has the virus on it (is contaminated) and then touching your mouth, nose, or eyes. What increases the risk? Certain things may make you more likely to get the flu. These include:  Not washing your hands often.  Having close contact with many people during cold and flu season.  Touching your mouth, eyes, or nose without first washing your hands.  Not getting a flu shot every year. You may have a higher risk for the flu, along with serious problems such as a lung infection (pneumonia), if you:  Are older than 65.  Are pregnant.  Have a weakened disease-fighting system (immune system) because of a disease or taking certain medicines.  Have a long-term (chronic) illness, such as: ? Heart, kidney, or lung disease. ? Diabetes. ? Asthma.  Have a liver disorder.  Are very overweight (morbidly obese).  Have anemia. This is a condition that  affects your red blood cells. What are the signs or symptoms? Symptoms usually begin suddenly and last 4-14 days. They may include:  Fever and chills.  Headaches, body aches, or muscle aches.  Sore throat.  Cough.  Runny or stuffy (congested) nose.  Chest discomfort.  Not wanting to eat as much as normal (poor appetite).  Weakness or feeling tired (fatigue).  Dizziness.  Feeling sick to your stomach (nauseous) or throwing up (vomiting). How is this treated? If the flu is found early, you can be treated with medicine that can help reduce how bad the illness is and how long it lasts (antiviral medicine). This may be given by mouth (orally) or through an IV tube. Taking care of yourself at home can help your symptoms get better. Your doctor may suggest:  Taking over-the-counter medicines.  Drinking plenty of fluids. The flu often goes away on its own. If you have very bad symptoms or other problems, you may be treated in a hospital. Follow these instructions at home:     Activity  Rest as needed. Get plenty of sleep.  Stay home from work or school as told by your doctor. ? Do not leave home until you do not have a fever for 24 hours without taking medicine. ? Leave home only to visit your doctor. Eating and drinking  Take an ORS (oral rehydration solution). This is a drink that is sold at  pharmacies and stores.  Drink enough fluid to keep your pee (urine) pale yellow.  Drink clear fluids in small amounts as you are able. Clear fluids include: ? Water. ? Ice chips. ? Fruit juice that has water added (diluted fruit juice). ? Low-calorie sports drinks.  Eat bland, easy-to-digest foods in small amounts as you are able. These foods include: ? Bananas. ? Applesauce. ? Rice. ? Lean meats. ? Toast. ? Crackers.  Do not eat or drink: ? Fluids that have a lot of sugar or caffeine. ? Alcohol. ? Spicy or fatty foods. General instructions  Take over-the-counter and  prescription medicines only as told by your doctor.  Use a cool mist humidifier to add moisture to the air in your home. This can make it easier for you to breathe.  Cover your mouth and nose when you cough or sneeze.  Wash your hands with soap and water often, especially after you cough or sneeze. If you cannot use soap and water, use alcohol-based hand sanitizer.  Keep all follow-up visits as told by your doctor. This is important. How is this prevented?   Get a flu shot every year. You may get the flu shot in late summer, fall, or winter. Ask your doctor when you should get your flu shot.  Avoid contact with people who are sick during fall and winter (cold and flu season). Contact a doctor if:  You get new symptoms.  You have: ? Chest pain. ? Watery poop (diarrhea). ? A fever.  Your cough gets worse.  You start to have more mucus.  You feel sick to your stomach.  You throw up. Get help right away if you:  Have shortness of breath.  Have trouble breathing.  Have skin or nails that turn a bluish color.  Have very bad pain or stiffness in your neck.  Get a sudden headache.  Get sudden pain in your face or ear.  Cannot eat or drink without throwing up. Summary  Influenza ("the flu") is an infection in the lungs, nose, and throat. It is caused by a virus.  Take over-the-counter and prescription medicines only as told by your doctor.  Getting a flu shot every year is the best way to avoid getting the flu. This information is not intended to replace advice given to you by your health care provider. Make sure you discuss any questions you have with your health care provider. Document Released: 09/18/2008 Document Revised: 05/28/2018 Document Reviewed: 05/28/2018 Elsevier Interactive Patient Education  2019 [employer].

## 2019-03-06 NOTE — Progress Notes (Signed)
Chief Complaint  Patient presents with  . sore throat    sore throat. cough, fever- 103, been going on for 28 hours.     Subjective:  Ronald Anderson is a 33 y.o. male who presents for a one day history of fever, cough and sore throat.   Denies dizziness, ear pain, rhinorrhea, nasal congestion, shortness of breath, abdominal pain, N/V/D.   He did get a flu shot.   Treatment to date: Tylenol.  + sick contacts.  No other aggravating or relieving factors.  No other c/o.  ROS as in subjective.   Objective: Vitals:   03/06/19 1606  BP: 120/70  Pulse: 76  Resp: 16  Temp: 99.5 F (37.5 C)  SpO2: 97%    General appearance: Alert, WD/WN, no distress, mildly ill appearing                             Skin: warm, no rash                           Head: no sinus tenderness                            Eyes: conjunctiva normal, corneas clear, PERRLA                            Ears: pearly TMs, external ear canals normal                          Nose: septum midline, turbinates swollen, with erythema and clear discharge             Mouth/throat: MMM, tongue normal, mild pharyngeal erythema                           Neck: supple, no adenopathy, no thyromegaly, nontender                          Heart: RRR, normal S1, S2, no murmurs                         Lungs: CTA bilaterally, no wheezes, rales, or rhonchi      Assessment: Influenza - Plan: oseltamivir (TAMIFLU) 75 MG capsule  Sore throat - Plan: Influenza A/B, POCT rapid strep A  Fever, unspecified fever cause - Plan: Influenza A/B, POCT rapid strep A    Plan: Rapid strep negative.  Flu swab positive  Discussed diagnosis and treatment of influenza. Tamiflu prescribed. Discussed potential side effects. Stay well hydrated. Stay home and rest.   Suggested symptomatic OTC remedies. Nasal saline spray for congestion.  Tylenol or Ibuprofen OTC for fever and malaise.  Call/return if not improving by Monday or if he is worsening. Work  note given for today, Saturday and Sunday.

## 2019-03-09 ENCOUNTER — Encounter: Payer: Self-pay | Admitting: Internal Medicine

## 2019-03-24 ENCOUNTER — Other Ambulatory Visit: Payer: Self-pay | Admitting: Family Medicine

## 2019-03-24 DIAGNOSIS — J45909 Unspecified asthma, uncomplicated: Secondary | ICD-10-CM

## 2019-03-24 MED ORDER — ALBUTEROL SULFATE HFA 108 (90 BASE) MCG/ACT IN AERS: INHALATION_SPRAY | RESPIRATORY_TRACT | 0 refills | Status: DC

## 2019-03-24 NOTE — Telephone Encounter (Signed)
[facility] is requesting to fill pt albuterol. Please advise KH °

## 2019-04-08 ENCOUNTER — Telehealth: Payer: Self-pay | Admitting: Family Medicine

## 2019-04-08 NOTE — Telephone Encounter (Signed)
Please find out which dates he was out. I know I gave him a work note for the dates he needed to be out. Which date did he go back? I will be in the office tomorrow and can sign off on this.

## 2019-04-08 NOTE — Telephone Encounter (Signed)
Please advise 

## 2019-04-08 NOTE — Telephone Encounter (Signed)
Left message for pt to call me back 

## 2019-04-08 NOTE — Telephone Encounter (Signed)
Pt called and states his employer is requiring FMLA for the days he was taken out. Pt was seen on 03/06/2019. Pt emailed me forms and states they must be back to employer by Friday. Sending back to [place] due to Vickie being out. Pt can be reached at [phone].

## 2019-04-08 NOTE — Telephone Encounter (Signed)
Pt states he just needs the dates 3/13- 3/15 when he had the flu. He gave a verbal to fax back to [employer] and then also mail a copy to him. I will put this on your desk to fill out.

## 2019-04-08 NOTE — Telephone Encounter (Signed)
Thanks

## 2019-04-20 ENCOUNTER — Telehealth: Payer: Self-pay | Admitting: Family Medicine

## 2019-04-20 MED ORDER — FLUOXETINE HCL 20 MG PO CAPS: 20.0000 mg | ORAL_CAPSULE | Freq: Every day | ORAL | 1 refills | Status: DC

## 2019-04-20 NOTE — Telephone Encounter (Signed)
Pharmacy sent in request stating that insurance will not cover fluxentine 20 mg, the preferred alternative is fluoxetine capsule  please resend to  [id number] - [place], [place] - [address] AT [facility] OF [address] & [address]

## 2019-05-01 ENCOUNTER — Encounter: Payer: Self-pay | Admitting: Internal Medicine

## 2019-05-01 ENCOUNTER — Telehealth: Payer: Self-pay | Admitting: Family Medicine

## 2019-05-01 NOTE — Telephone Encounter (Signed)
Dismissal letter in guarantor snapshot  °

## 2019-07-08 ENCOUNTER — Other Ambulatory Visit: Payer: Self-pay | Admitting: Family Medicine

## 2019-07-08 DIAGNOSIS — J4530 Mild persistent asthma, uncomplicated: Secondary | ICD-10-CM

## 2019-07-08 DIAGNOSIS — J309 Allergic rhinitis, unspecified: Secondary | ICD-10-CM

## 2019-09-25 ENCOUNTER — Other Ambulatory Visit: Payer: Self-pay | Admitting: Family Medicine

## 2019-09-25 DIAGNOSIS — J4530 Mild persistent asthma, uncomplicated: Secondary | ICD-10-CM

## 2019-09-30 ENCOUNTER — Other Ambulatory Visit: Payer: Self-pay | Admitting: Family Medicine

## 2019-09-30 DIAGNOSIS — J4530 Mild persistent asthma, uncomplicated: Secondary | ICD-10-CM

## 2019-10-05 ENCOUNTER — Other Ambulatory Visit: Payer: Self-pay | Admitting: Family Medicine

## 2019-10-05 DIAGNOSIS — J45909 Unspecified asthma, uncomplicated: Secondary | ICD-10-CM

## 2019-10-14 ENCOUNTER — Telehealth: Payer: Self-pay | Admitting: Family Medicine

## 2019-10-14 DIAGNOSIS — J4530 Mild persistent asthma, uncomplicated: Secondary | ICD-10-CM

## 2019-10-14 MED ORDER — FLUTICASONE-SALMETEROL 500-50 MCG/DOSE IN AEPB: 1.0000 | INHALATION_SPRAY | Freq: Two times a day (BID) | RESPIRATORY_TRACT | 1 refills | Status: DC

## 2019-10-14 NOTE — Telephone Encounter (Signed)
Pt needs refills on asthma medicine Advair, pt uses [id number] - [place], [place] AT [address]  Pt can be reached at [phone]

## 2019-10-21 ENCOUNTER — Encounter: Payer: Self-pay | Admitting: Family Medicine

## 2019-10-21 DIAGNOSIS — J4530 Mild persistent asthma, uncomplicated: Secondary | ICD-10-CM

## 2019-10-21 DIAGNOSIS — J309 Allergic rhinitis, unspecified: Secondary | ICD-10-CM

## 2019-10-21 MED ORDER — MONTELUKAST SODIUM 10 MG PO TABS: 10.0000 mg | ORAL_TABLET | Freq: Every day | ORAL | 0 refills | Status: DC

## 2020-01-11 ENCOUNTER — Encounter (HOSPITAL_COMMUNITY): Payer: Self-pay | Admitting: *Deleted

## 2020-01-11 ENCOUNTER — Emergency Department (HOSPITAL_COMMUNITY)
Admission: EM | Admit: 2020-01-11 | Discharge: 2020-01-11 | Disposition: A | Discharge status: Home / Self Care | Payer: 59 | Attending: Emergency Medicine | Admitting: Emergency Medicine

## 2020-01-11 ENCOUNTER — Other Ambulatory Visit: Payer: Self-pay

## 2020-01-11 ENCOUNTER — Telehealth: Payer: Self-pay

## 2020-01-11 ENCOUNTER — Emergency Department (HOSPITAL_COMMUNITY): Payer: 59

## 2020-01-11 DIAGNOSIS — R079 Chest pain, unspecified: Secondary | ICD-10-CM

## 2020-01-11 DIAGNOSIS — Z79899 Other long term (current) drug therapy: Secondary | ICD-10-CM | POA: Diagnosis not present

## 2020-01-11 DIAGNOSIS — J45909 Unspecified asthma, uncomplicated: Secondary | ICD-10-CM | POA: Insufficient documentation

## 2020-01-11 DIAGNOSIS — R030 Elevated blood-pressure reading, without diagnosis of hypertension: Secondary | ICD-10-CM | POA: Diagnosis not present

## 2020-01-11 DIAGNOSIS — R002 Palpitations: Secondary | ICD-10-CM | POA: Diagnosis not present

## 2020-01-11 LAB — CBC
HCT: 48.4 % (ref 39.0–52.0)
Hemoglobin: 16.9 g/dL (ref 13.0–17.0)
MCH: 32.1 pg (ref 26.0–34.0)
MCHC: 34.9 g/dL (ref 30.0–36.0)
MCV: 91.8 fL (ref 80.0–100.0)
Platelets: 288 10*3/uL (ref 150–400)
RBC: 5.27 MIL/uL (ref 4.22–5.81)
RDW: 11.6 % (ref 11.5–15.5)
WBC: 11.2 10*3/uL — ABNORMAL HIGH (ref 4.0–10.5)
nRBC: 0 % (ref 0.0–0.2)

## 2020-01-11 LAB — BASIC METABOLIC PANEL
Anion gap: 11 (ref 5–15)
BUN: 13 mg/dL (ref 6–20)
CO2: 23 mmol/L (ref 22–32)
Calcium: 9 mg/dL (ref 8.9–10.3)
Chloride: 103 mmol/L (ref 98–111)
Creatinine, Ser: 1 mg/dL (ref 0.61–1.24)
GFR calc Af Amer: >60 mL/min (ref >60)
GFR calc non Af Amer: >60 mL/min (ref >60)
Glucose, Bld: 114 mg/dL — ABNORMAL HIGH (ref 70–99)
Potassium: 3.9 mmol/L (ref 3.5–5.1)
Sodium: 137 mmol/L (ref 135–145)

## 2020-01-11 LAB — TROPONIN I (HIGH SENSITIVITY)
Troponin I (High Sensitivity): <2 ng/L (ref <18)
Troponin I (High Sensitivity): <2 ng/L (ref <18)

## 2020-01-11 MED ORDER — SODIUM CHLORIDE 0.9% FLUSH: 3.0000 mL | Freq: Once | INTRAVENOUS | Status: DC

## 2020-01-11 MED ORDER — AMLODIPINE BESYLATE 10 MG PO TABS: 10.0000 mg | ORAL_TABLET | Freq: Every day | ORAL | 1 refills | Status: DC

## 2020-01-11 NOTE — Discharge Instructions (Addendum)
Your heart enzymes are negative. Minimize caffeine use as we discussed. Start taking blood pressure medication and follow-up closely for further direction from your primary doctor. If chest pain returns, persists or you develop new or worsening symptoms especially with exertion or with nausea/vomiting or sweating call EMS or come to the ER immediately.

## 2020-01-11 NOTE — ED Provider Notes (Signed)
[facility] EMERGENCY DEPARTMENT Provider Note   CSN: [id number] Arrival date & time: 01/11/20  0109     History Chief Complaint  Patient presents with  . Chest Pain    Ronald Anderson is a 34 y.o. male.  Patient with history of asthma, reflux, elevated blood pressure currently not a medication, caffeine use presents with chest pain.  Patient was at work not exerting himself and he develop palpitations and chest pain that lasted 4 to 5 minutes followed by burning sensation across his body.  Patient had this intermittently multiple times and currently no chest pain or pressure.  Patient had nitro and baby aspirin's with EMS earlier this morning.  No recent exertional symptoms.  Patient has a family history of cardiac however his dad had other risk factors.  Patient denies blood clot risk factors.  No fevers or chills.  Patient had recent negative Covid test.        Past Medical History:  Diagnosis Date  . Allergic rhinitis   . Asthma   . Dysthymia   . GERD (gastroesophageal reflux disease)     Patient Active Problem List   Diagnosis Date Noted  . Asthma 08/11/2013  . GERD (gastroesophageal reflux disease) 12/24/2011    Past Surgical History:  Procedure Laterality Date  . WISDOM TOOTH EXTRACTION         No family history on file.  Social History   Tobacco Use  . Smoking status: Never Smoker  . Smokeless tobacco: Never Used  Substance Use Topics  . Alcohol use: Yes    Comment: 6 beers per month  . Drug use: No    Home Medications Prior to Admission medications   Medication Sig Start Date End Date Taking? Authorizing Provider  albuterol (VENTOLIN HFA) 108 (90 Base) MCG/ACT inhaler INHALE 2 PUFFS INTO THE LUNGS EVERY 6 (SIX) HOURS AS NEEDED FOR WHEEZING. 03/24/19   [name], MD  amLODipine (NORVASC) 10 MG tablet Take 1 tablet (10 mg total) by mouth daily. 01/11/20   Blane Ohara, MD  FLUoxetine (PROZAC) 20 MG capsule Take 1 capsule (20  mg total) by mouth daily. 04/20/19   [name], MD  Fluticasone-Salmeterol (ADVAIR DISKUS) 500-50 MCG/DOSE AEPB Inhale 1 puff into the lungs 2 (two) times daily. 10/14/19   [name], MD  montelukast (SINGULAIR) 10 MG tablet Take 1 tablet (10 mg total) by mouth at bedtime. 10/21/19   [name], MD  omeprazole (PRILOSEC OTC) 20 MG tablet Take 1 tablet (20 mg total) by mouth 2 (two) times daily. 11/03/13   [name], MD  oseltamivir (TAMIFLU) 75 MG capsule Take 1 capsule (75 mg total) by mouth 2 (two) times daily. 03/06/19   Henson, Vickie L, NP-C    Allergies    Flonase [fluticasone] and Penicillins  Review of Systems   Review of Systems  Constitutional: Negative for chills and fever.  HENT: Negative for congestion.   Eyes: Negative for visual disturbance.  Respiratory: Negative for shortness of breath.   Cardiovascular: Positive for chest pain.  Gastrointestinal: Negative for abdominal pain and vomiting.  Genitourinary: Negative for dysuria and flank pain.  Musculoskeletal: Negative for back pain, neck pain and neck stiffness.  Skin: Negative for rash.  Neurological: Negative for light-headedness and headaches.    Physical Exam Updated Vital Signs BP (!) 165/109   Pulse 74   Temp 98.4 F (36.9 C) (Temporal)   Resp 20   Ht 6' (1.829 m)  Wt 83.8 kg   SpO2 96%   BMI 25.06 kg/m   Physical Exam Vitals and nursing note reviewed.  Constitutional:      Appearance: He is well-developed.  HENT:     Head: Normocephalic and atraumatic.  Eyes:     General:        Right eye: No discharge.        Left eye: No discharge.     Conjunctiva/sclera: Conjunctivae normal.  Neck:     Trachea: No tracheal deviation.  Cardiovascular:     Rate and Rhythm: Normal rate and regular rhythm.     Heart sounds: Normal heart sounds.  Pulmonary:     Effort: Pulmonary effort is normal.     Breath sounds: Normal breath sounds.  Abdominal:     General: There is no distension.      Palpations: Abdomen is soft.     Tenderness: There is no abdominal tenderness. There is no guarding.  Musculoskeletal:     Cervical back: Normal range of motion and neck supple.     Right lower leg: No edema.     Left lower leg: No edema.  Skin:    General: Skin is warm.     Findings: No rash.  Neurological:     Mental Status: He is alert and oriented to person, place, and time.     ED Results / Procedures / Treatments   Labs (all labs ordered are listed, but only abnormal results are displayed) Labs Reviewed  BASIC METABOLIC PANEL - Abnormal; Notable for the following components:      Result Value   Glucose, Bld 114 (*)    All other components within normal limits  CBC - Abnormal; Notable for the following components:   WBC 11.2 (*)    All other components within normal limits  TROPONIN I (HIGH SENSITIVITY)  TROPONIN I (HIGH SENSITIVITY)  TROPONIN I (HIGH SENSITIVITY)    EKG EKG Interpretation  Date/Time:  Monday January 11 2020 01:01:39 EST Ventricular Rate:  83 PR Interval:  146 QRS Duration: 84 QT Interval:  372 QTC Calculation: 437 R Axis:   63 Text Interpretation: Sinus rhythm with frequent Premature ventricular complexes Possible Left atrial enlargement Confirmed by Dory Horn) on 01/11/2020 1:08:25 AM   Radiology DG Chest 2 View  Result Date: 01/11/2020 CLINICAL DATA:  Chest pain. EXAM: CHEST - 2 VIEW COMPARISON:  May 14, 2010 FINDINGS: The heart size and mediastinal contours are within normal limits. Both lungs are clear. The visualized skeletal structures are unremarkable. IMPRESSION: No active cardiopulmonary disease. Electronically Signed   By: Constance Holster M.D.   On: 01/11/2020 02:21    Procedures Procedures (including critical care time)  Medications Ordered in ED Medications  sodium chloride flush (NS) 0.9 % injection 3 mL (has no administration in time range)    ED Course  I have reviewed the triage vital signs and the  nursing notes.  Pertinent labs & imaging results that were available during my care of the patient were reviewed by me and considered in my medical decision making (see chart for details).    MDM Rules/Calculators/A&P                      Patient presents with intermittent chest discomfort since early this morning just after midnight.  Patient symptoms have resolved on ER.  Patient has been followed by primary doctor and discussions about starting blood pressure medications however is not currently on  them.  Initial EKG had nonspecific findings no signs of acute ischemia.  Initial troponin negative.  Patient is low risk with your score.  Discussed importance of lifestyle changes and close follow-up with primary doctor.  Plan for work note, starting amlodipine and close outpatient follow-up.  Second troponin pending.  Remainder blood work unremarkable minimal elevated white blood cell count and patient has no signs of significant infection.  Hemoglobin and renal function normal.  Results and differential diagnosis were discussed with the patient/parent/guardian. Xrays were independently reviewed by myself.  Close follow up outpatient was discussed, comfortable with the plan.   Medications  sodium chloride flush (NS) 0.9 % injection 3 mL (has no administration in time range)    Vitals:   01/11/20 0109 01/11/20 0126 01/11/20 0522 01/11/20 0750  BP: (!) 187/112  (!) 161/122 (!) 165/109  Pulse: 88  78 74  Resp: 18  20 20   Temp: 97.9 F (36.6 C)  97.6 F (36.4 C) 98.4 F (36.9 C)  TempSrc: Oral  Oral Temporal  SpO2: 97%  99% 96%  Weight:  83.8 kg    Height:  6' (1.829 m)      Final diagnoses:  Acute chest pain    Final Clinical Impression(s) / ED Diagnoses Final diagnoses:  Acute chest pain    Rx / DC Orders ED Discharge Orders         Ordered    amLODipine (NORVASC) 10 MG tablet  Daily     01/11/20 0813           01/13/20, MD 01/11/20 1313

## 2020-01-11 NOTE — Telephone Encounter (Signed)
Is seeing AK on 20th

## 2020-01-11 NOTE — ED Triage Notes (Signed)
The pt arrived by gems from work  Irregular heart beat  While he was sitting  Chest pain  Also  Iv per ems nitro ans baby aspirin 324gm on way here iv per ems

## 2020-01-13 ENCOUNTER — Ambulatory Visit (INDEPENDENT_AMBULATORY_CARE_PROVIDER_SITE_OTHER): Payer: 59 | Admitting: Cardiology

## 2020-01-13 ENCOUNTER — Encounter: Payer: Self-pay | Admitting: Cardiology

## 2020-01-13 ENCOUNTER — Other Ambulatory Visit: Payer: Self-pay

## 2020-01-13 VITALS — BP 143/102 | HR 73 | Ht 73.0 in | Wt 211.0 lb

## 2020-01-13 DIAGNOSIS — R0789 Other chest pain: Secondary | ICD-10-CM

## 2020-01-13 DIAGNOSIS — Z8249 Family history of ischemic heart disease and other diseases of the circulatory system: Secondary | ICD-10-CM | POA: Diagnosis not present

## 2020-01-13 DIAGNOSIS — I10 Essential (primary) hypertension: Secondary | ICD-10-CM | POA: Diagnosis not present

## 2020-01-13 DIAGNOSIS — I493 Ventricular premature depolarization: Secondary | ICD-10-CM

## 2020-01-13 MED ORDER — OLMESARTAN MEDOXOMIL-HCTZ 20-12.5 MG PO TABS: 1.0000 | ORAL_TABLET | Freq: Every day | ORAL | 2 refills | Status: DC

## 2020-01-13 NOTE — Progress Notes (Signed)
Primary Physician:  Ronald Nian, MD   Patient ID: Ronald Anderson, male    DOB: 06/23/1986, 34 y.o.   MRN: [id number]  Subjective:    Chief Complaint  Patient presents with  . Hypertension  . Chest Pain    [name]  is a 34 y.o. male  with Caucasian male with asthma, acid reflux, hypertension recently started on amlodipine, referred to [place] for evaluation of chest pain.  Patient was recently seen in the emergency room on 01/10/2019: After he had intermittent episodes of chest pain and palpitations lasting approximately 4-5 minutes.  EKG revealed frequent PVCs, and no ischemic changes.  Troponin was also negative. He was started on amlodipine. He was advised to follow-up outpatient. His mother is currently a patient of ours for A fib and nonischemic cardiomyopathy.  He has had 2 episodes of chest pain over the last few months. First episode was in November that lasted for a few minutes and resolved on its own and then the second episode was on 01/18. He decided to go to ER for his second episode as his symptoms persisted for longer than 30 minutes. States chest pain would alternate with burning sensation across his body. He has not had any recurrent symptoms since his ER visit.  He is tolerating amlodipine well, blood pressure was normal yesterday when he checked it at home.   No former tobacco use or illicit drug use. Occasional/rare alcohol use.  He has a family history of heart disease in his father who had his first MI in his late 68's-early 10's. Very active with his job with walking 7-8 miles 4-5 days a week.   Past Medical History:  Diagnosis Date  . Allergic rhinitis   . Asthma   . Dysthymia   . GERD (gastroesophageal reflux disease)     Past Surgical History:  Procedure Laterality Date  . WISDOM TOOTH EXTRACTION      Social History   Socioeconomic History  . Marital status: Single    Spouse name: Not on file  . Number of children: 1  . Years of education:  Not on file  . Highest education level: Not on file  Occupational History  . Occupation: Sports administrator: [employer]  Tobacco Use  . Smoking status: Never Smoker  . Smokeless tobacco: Never Used  Substance and Sexual Activity  . Alcohol use: Yes    Comment: 6 beers per month  . Drug use: No  . Sexual activity: Not on file  Other Topics Concern  . Not on file  Social History Narrative   Engaged   Social Determinants of Health   Financial Resource Strain:   . Difficulty of Paying Living Expenses: Not on file  Food Insecurity:   . Worried About Programme researcher, broadcasting/film/video in the Last Year: Not on file  . Ran Out of Food in the Last Year: Not on file  Transportation Needs:   . Lack of Transportation (Medical): Not on file  . Lack of Transportation (Non-Medical): Not on file  Physical Activity:   . Days of Exercise per Week: Not on file  . Minutes of Exercise per Session: Not on file  Stress:   . Feeling of Stress : Not on file  Social Connections:   . Frequency of Communication with Friends and Family: Not on file  . Frequency of Social Gatherings with Friends and Family: Not on file  . Attends Religious Services: Not on file  .  Active Member of Clubs or Organizations: Not on file  . Attends Banker Meetings: Not on file  . Marital Status: Not on file  Intimate Partner Violence:   . Fear of Current or Ex-Partner: Not on file  . Emotionally Abused: Not on file  . Physically Abused: Not on file  . Sexually Abused: Not on file    Review of Systems  Constitution: Negative for decreased appetite, malaise/fatigue, weight gain and weight loss.  Eyes: Negative for visual disturbance.  Cardiovascular: Negative for chest pain, claudication, dyspnea on exertion, leg swelling, orthopnea, palpitations and syncope.  Respiratory: Negative for hemoptysis and wheezing.   Endocrine: Negative for cold intolerance and heat intolerance.  Hematologic/Lymphatic: Does not  bruise/bleed easily.  Skin: Negative for nail changes.  Musculoskeletal: Negative for muscle weakness and myalgias.  Gastrointestinal: Negative for abdominal pain, change in bowel habit, nausea and vomiting.  Neurological: Negative for difficulty with concentration, dizziness, focal weakness and headaches.  Psychiatric/Behavioral: Negative for altered mental status and suicidal ideas.  All other systems reviewed and are negative.     Objective:  Blood pressure (!) 143/102, pulse 73, height 6\' 1"  (1.854 m), weight 211 lb (95.7 kg), SpO2 97 %. Body mass index is 27.84 kg/m.    Physical Exam  Constitutional: He is oriented to person, place, and time. Vital signs are normal. He appears well-developed and well-nourished.  HENT:  Head: Normocephalic and atraumatic.  Cardiovascular: Normal rate, regular rhythm, normal heart sounds and intact distal pulses.  Pulmonary/Chest: Effort normal and breath sounds normal. No accessory muscle usage. No respiratory distress.  Abdominal: Soft. Bowel sounds are normal.  Musculoskeletal:        General: Normal range of motion.     Cervical back: Normal range of motion.  Neurological: He is alert and oriented to person, place, and time.  Skin: Skin is warm and dry.  Vitals reviewed.  Radiology: No results found.  Laboratory examination:    CMP Latest Ref Rng & Units 01/11/2020  Glucose 70 - 99 mg/dL 01/13/2020)  BUN 6 - 20 mg/dL 13  Creatinine 967(E - 9.38 mg/dL 1.01  Sodium 7.51 - 025 mmol/L 137  Potassium 3.5 - 5.1 mmol/L 3.9  Chloride 98 - 111 mmol/L 103  CO2 22 - 32 mmol/L 23  Calcium 8.9 - 10.3 mg/dL 9.0   CBC Latest Ref Rng & Units 01/11/2020  WBC 4.0 - 10.5 K/uL 11.2(H)  Hemoglobin 13.0 - 17.0 g/dL 01/13/2020  Hematocrit 77.8 - 52.0 % 48.4  Platelets 150 - 400 K/uL 288   Lipid Panel  No results found for: CHOL, TRIG, HDL, CHOLHDL, VLDL, LDLCALC, LDLDIRECT HEMOGLOBIN A1C No results found for: HGBA1C, MPG TSH No results for input(s): TSH in the  last 8760 hours.  PRN Meds:. Medications Discontinued During This Encounter  Medication Reason  . oseltamivir (TAMIFLU) 75 MG capsule Completed Course   Current Meds  Medication Sig  . albuterol (VENTOLIN HFA) 108 (90 Base) MCG/ACT inhaler INHALE 2 PUFFS INTO THE LUNGS EVERY 6 (SIX) HOURS AS NEEDED FOR WHEEZING.  24.2 amLODipine (NORVASC) 10 MG tablet Take 1 tablet (10 mg total) by mouth daily.  Marland Kitchen FLUoxetine (PROZAC) 20 MG capsule Take 1 capsule (20 mg total) by mouth daily.  . Fluticasone-Salmeterol (ADVAIR DISKUS) 500-50 MCG/DOSE AEPB Inhale 1 puff into the lungs 2 (two) times daily.  . montelukast (SINGULAIR) 10 MG tablet Take 1 tablet (10 mg total) by mouth at bedtime.  Marland Kitchen omeprazole (PRILOSEC OTC) 20 MG tablet Take  1 tablet (20 mg total) by mouth 2 (two) times daily. (Patient taking differently: Take 20 mg by mouth daily. )    Cardiac Studies:   none  Assessment:   Atypical chest pain - Plan: EKG 12-Lead, PCV ECHOCARDIOGRAM COMPLETE, CT CARDIAC SCORING  Primary hypertension - Plan: PCV ECHOCARDIOGRAM COMPLETE, Basic metabolic panel  Family history of early CAD - Plan: Lipid Profile  Premature ventricular complex  EKG 01/13/2020: Normal sinus rhythm at 69 bpm, normal axis, no evidence of ischemia.   Recommendations:   Patient presents for evaluation for chest pain. His symptoms of chest pain are atypical, suspect may be related to uncontrolled hypertension. Differential also includes GERD. He was noted to have frequent PVC's in the ER; however, none are noted today. I have discussed etiology of PVC's and reassured him. Physical exam and EKG are unremarkable. Do not feel that he needs stress testing at this time, would recommend aggressive risk factor modification, particularly with BP control. If he continues to have chest pain despite controlling his blood pressure, will consider further evaluation. I would recommend cardiac score CT to help risk stratify given his hypertension  and his family history of early CAD. Will also obtain lipid profile testing. I will start him on Omesartan HCT in addition to amlodipine for better BP control. Will need BMP in 2 weeks for surveillance of kidney function. If he continues to have elevated blood pressure, should consider evaluation for FMD. Will schedule for echocardiogram to exclude any structural abnormalities. We have discussed needed diet and lifestyle changes to help with reducing his risk. He has already made some changes to his diet, which I have urged him to continue. I will plan to see him back in approximately 4 weeks for follow up to discuss calcium score CT results, echo, and to follow up on hypertension.    *I have discussed this case with Dr. Einar Gip and he personally examined the patient and participated in formulating the plan.*   Miquel Dunn, MSN, APRN, FNP-C [facility]. [place] Office: [phone] Fax: [phone]

## 2020-01-18 ENCOUNTER — Encounter: Payer: Self-pay | Admitting: Family Medicine

## 2020-01-21 ENCOUNTER — Other Ambulatory Visit: Payer: Self-pay

## 2020-01-21 ENCOUNTER — Ambulatory Visit (INDEPENDENT_AMBULATORY_CARE_PROVIDER_SITE_OTHER): Payer: 59

## 2020-01-21 DIAGNOSIS — R0789 Other chest pain: Secondary | ICD-10-CM

## 2020-01-21 DIAGNOSIS — I10 Essential (primary) hypertension: Secondary | ICD-10-CM

## 2020-02-02 ENCOUNTER — Encounter: Payer: Self-pay | Admitting: Family Medicine

## 2020-02-08 ENCOUNTER — Ambulatory Visit: Payer: 59 | Admitting: Cardiology

## 2020-02-09 ENCOUNTER — Other Ambulatory Visit: Payer: Self-pay | Admitting: Family Medicine

## 2020-02-09 DIAGNOSIS — J309 Allergic rhinitis, unspecified: Secondary | ICD-10-CM

## 2020-02-09 DIAGNOSIS — J4530 Mild persistent asthma, uncomplicated: Secondary | ICD-10-CM

## 2020-03-03 ENCOUNTER — Telehealth: Payer: Self-pay | Admitting: Cardiology

## 2020-03-10 ENCOUNTER — Other Ambulatory Visit: Payer: Self-pay

## 2020-03-10 MED ORDER — AMLODIPINE BESYLATE 10 MG PO TABS: 10.0000 mg | ORAL_TABLET | Freq: Every day | ORAL | 1 refills | Status: DC

## 2020-03-28 ENCOUNTER — Other Ambulatory Visit: Payer: Self-pay | Admitting: Family Medicine

## 2020-03-30 NOTE — Telephone Encounter (Signed)
Ronald Anderson is requesting to fill pt prozac . Please advise. KH °

## 2020-04-08 ENCOUNTER — Other Ambulatory Visit: Payer: Self-pay | Admitting: Cardiology

## 2020-04-10 ENCOUNTER — Other Ambulatory Visit: Payer: Self-pay | Admitting: Cardiology

## 2020-04-10 ENCOUNTER — Other Ambulatory Visit: Payer: Self-pay | Admitting: Family Medicine

## 2020-04-11 NOTE — Telephone Encounter (Signed)
[facility] is requesting fluoxetine. Please advise [facility]

## 2020-05-10 ENCOUNTER — Other Ambulatory Visit: Payer: Self-pay | Admitting: Family Medicine

## 2020-05-10 DIAGNOSIS — J309 Allergic rhinitis, unspecified: Secondary | ICD-10-CM

## 2020-05-10 DIAGNOSIS — J4530 Mild persistent asthma, uncomplicated: Secondary | ICD-10-CM

## 2020-05-26 ENCOUNTER — Telehealth: Payer: Self-pay

## 2020-05-26 NOTE — Telephone Encounter (Signed)
My chart message has not been read so a letter was mailed to advise of the need for an appt. KH

## 2020-07-11 ENCOUNTER — Ambulatory Visit: Payer: 59 | Admitting: Family Medicine

## 2020-07-11 ENCOUNTER — Encounter: Payer: Self-pay | Admitting: Family Medicine

## 2020-07-11 ENCOUNTER — Other Ambulatory Visit: Payer: Self-pay

## 2020-07-11 VITALS — BP 110/78 | HR 52 | Temp 98.0°F | Wt 215.4 lb

## 2020-07-11 DIAGNOSIS — R0683 Snoring: Secondary | ICD-10-CM

## 2020-07-11 DIAGNOSIS — J4520 Mild intermittent asthma, uncomplicated: Secondary | ICD-10-CM | POA: Diagnosis not present

## 2020-07-11 DIAGNOSIS — M549 Dorsalgia, unspecified: Secondary | ICD-10-CM

## 2020-07-11 NOTE — Patient Instructions (Signed)
Proper posturing is important.  Heat for 20 minutes 3 times per day with gentle stretching afterwards.  2 Aleve twice per day for the next week or so.

## 2020-07-11 NOTE — Progress Notes (Signed)
   Subjective:    Patient ID: Ronald Anderson, male    DOB: 1986/09/06, 34 y.o.   MRN: [id number]  HPI He is here for consult concerning multiple issues.  He does have an underlying history of asthma however has not been using a steroid inhaler in approximately 1 year.  He states that he uses the Ventolin only once per week.  He does have underlying allergies and is using Zyrtec with good results of that.  He also has been having intermittent difficulty with mid upper back pain and states that it radiates down into his leg however this usually occurs with physical activity.  No numbness, tingling or weakness.  He also states that his wife says that he snores.  He states that he does not wake up refreshed and occasionally will fall asleep easily.   Review of Systems     Objective:   Physical Exam Alert and in no distress .  Palpable muscle tension is noted in the mid paravertebral muscles with good motion of his back.  Negative straight leg raising and normal DTRs.       Assessment & Plan:  Mild intermittent asthma, unspecified whether complicated  Musculoskeletal back pain  Snoring - Plan: Home sleep test Since he is not having asthma symptoms right now, recommend he continue to use the albuterol but if he needs it more than twice a day during the week or twice a month at night, he is to let me know. Discussed proper back care with him in terms of proper posturing sitting, heat, stretching, anti-inflammatory medications.  He will keep me informed as to how he is doing on it. We will set him up for a sleep study. Over 30 minutes spent discussing all these issues with him.

## 2020-07-15 ENCOUNTER — Other Ambulatory Visit: Payer: Self-pay | Admitting: Cardiology

## 2020-08-17 ENCOUNTER — Other Ambulatory Visit: Payer: Self-pay | Admitting: Family Medicine

## 2020-08-17 DIAGNOSIS — J309 Allergic rhinitis, unspecified: Secondary | ICD-10-CM

## 2020-08-17 DIAGNOSIS — J4530 Mild persistent asthma, uncomplicated: Secondary | ICD-10-CM

## 2020-08-18 ENCOUNTER — Telehealth: Payer: Self-pay

## 2020-08-18 ENCOUNTER — Encounter: Payer: Self-pay | Admitting: Family Medicine

## 2020-08-18 NOTE — Telephone Encounter (Signed)
Pt was called and advised that we reached out to [facility] and had to leave a message for them to call back to the office or call the pt to advised where he was on the list. Pt will call his insurance company to let us know what new inahler will be covered. KH

## 2020-08-19 ENCOUNTER — Other Ambulatory Visit: Payer: Self-pay

## 2020-08-19 DIAGNOSIS — R0683 Snoring: Secondary | ICD-10-CM

## 2020-08-28 ENCOUNTER — Other Ambulatory Visit: Payer: Self-pay | Admitting: Cardiology

## 2020-10-04 ENCOUNTER — Other Ambulatory Visit: Payer: Self-pay | Admitting: Cardiology

## 2020-11-13 ENCOUNTER — Other Ambulatory Visit: Payer: Self-pay | Admitting: Cardiology

## 2020-12-03 ENCOUNTER — Other Ambulatory Visit: Payer: Self-pay | Admitting: Family Medicine

## 2020-12-03 DIAGNOSIS — J309 Allergic rhinitis, unspecified: Secondary | ICD-10-CM

## 2020-12-03 DIAGNOSIS — J4530 Mild persistent asthma, uncomplicated: Secondary | ICD-10-CM

## 2021-02-01 ENCOUNTER — Other Ambulatory Visit: Payer: Self-pay | Admitting: Cardiology

## 2021-03-06 ENCOUNTER — Other Ambulatory Visit: Payer: Self-pay | Admitting: Cardiology

## 2021-04-02 ENCOUNTER — Other Ambulatory Visit: Payer: Self-pay | Admitting: Family Medicine

## 2021-04-02 DIAGNOSIS — J309 Allergic rhinitis, unspecified: Secondary | ICD-10-CM

## 2021-04-02 DIAGNOSIS — J4530 Mild persistent asthma, uncomplicated: Secondary | ICD-10-CM

## 2021-04-07 ENCOUNTER — Other Ambulatory Visit: Payer: Self-pay | Admitting: Cardiology

## 2021-04-20 ENCOUNTER — Other Ambulatory Visit: Payer: Self-pay

## 2021-04-20 MED ORDER — OLMESARTAN MEDOXOMIL-HCTZ 20-12.5 MG PO TABS: 1.0000 | ORAL_TABLET | Freq: Every day | ORAL | 0 refills | Status: DC

## 2021-04-20 MED ORDER — AMLODIPINE BESYLATE 10 MG PO TABS: ORAL_TABLET | ORAL | 0 refills | Status: DC

## 2021-04-24 ENCOUNTER — Ambulatory Visit: Payer: 59 | Admitting: Family Medicine

## 2021-04-24 ENCOUNTER — Other Ambulatory Visit: Payer: Self-pay | Admitting: Family Medicine

## 2021-04-24 ENCOUNTER — Other Ambulatory Visit: Payer: Self-pay

## 2021-04-24 ENCOUNTER — Encounter: Payer: Self-pay | Admitting: Family Medicine

## 2021-04-24 VITALS — BP 134/82 | HR 76 | Temp 98.1°F | Wt 208.4 lb

## 2021-04-24 DIAGNOSIS — J309 Allergic rhinitis, unspecified: Secondary | ICD-10-CM

## 2021-04-24 DIAGNOSIS — J4530 Mild persistent asthma, uncomplicated: Secondary | ICD-10-CM

## 2021-04-24 DIAGNOSIS — Z23 Encounter for immunization: Secondary | ICD-10-CM | POA: Diagnosis not present

## 2021-04-24 NOTE — Progress Notes (Signed)
   Subjective:    Patient ID: Ronald Anderson, male    DOB: 29-Feb-1986, 35 y.o.   MRN: [id number]  HPI He is here for consult concerning continued difficulty with asthma.  He is supposed to be on Advair 500/50 but has been using the 250/50 instead because he was getting it from his father who was not using it.  He has had uses albuterol much more regularly for control that.  He also has underlying allergies and continues on Singulair as well as OTC medications.  He has had his Moderna shots. Review of record indicates need for Tdap and his next Moderna shot.  Review of Systems     Objective:   Physical Exam Alert and in no distress.  Lungs are clear to auscultation.       Assessment & Plan:  Mild persistent asthma, unspecified whether complicated  Allergic rhinitis, unspecified seasonality, unspecified trigger  Need for Tdap vaccination - Plan: Tdap vaccine greater than or equal to 7yo IM  Need for COVID-19 vaccine - Plan: Moderna Covid-19 Booster He can apparently get covered for Advair Diskus however my system will not allow me to send that in.  I will have my CMA call in d.a.w.  He is to use the Diskus at the 5500.  If he has difficulties he will call me. His immunizations were updated.

## 2021-04-25 NOTE — Progress Notes (Signed)
Primary Physician:  [name], MD   Patient ID: Ronald Anderson, male    DOB: October 12, 1986, 35 y.o.   MRN: [id number]  Subjective:    Chief Complaint  Patient presents with  . Hypertension    HPI: Ronald Anderson  is a 35 y.o. Caucasian male with asthma, acid reflux, hypertension, family history of premature CAD.  His mother has history of atrial fibrillation nonischemic cardiomyopathy.  Father with first MI in late 40s/early 50s.  Patient denies former tobacco use or illicit drug use.  He was originally referred to our office for evaluation of chest pain in January 2021.  He was last seen 01/13/2020 by Donnelly Stager who had advised 4-week follow-up of hypertension and cardiac testing results.  Patient was unfortunately lost to follow-up till now.  Patient now presents for follow-up with concerns of elevated blood pressure.  Patient reports he was without his blood pressure for approximately 1 week and noted Blood pressure as high as 153/110 mmHg, which prompted him to make follow-up appointment in our office.  Also previously been advised to undergo coronary calcium score, which has not been done.  Patient is presently asymptomatic, denies chest pain, palpitations, syncope, near syncope, dizziness, claudication.   Patient does note that his wife is currently 9 months pregnant and he has a 15-year-old at home, as well as he works approximately 3 hours/week.  Therefore patient has been experiencing increased stress as well as admits to poor diet compliance, eating out frequently.  He does remain very active at work, walking 7-8 miles 4 to 5 days/week without issue.  Patient reports recent work physical and lab work, including lipid testing which was reportedly normal, but no labs available for my personal review. He follows with PCP as well.   Past Medical History:  Diagnosis Date  . Allergic rhinitis   . Asthma   . Dysthymia   . GERD (gastroesophageal reflux disease)    Past Surgical  History:  Procedure Laterality Date  . WISDOM TOOTH EXTRACTION     Family History  Problem Relation Age of Onset  . Heart disease Mother   . Stroke Father   . Heart attack Father 1  . Hypertension Father    Social History   Tobacco Use  . Smoking status: Never Smoker  . Smokeless tobacco: Never Used  Substance Use Topics  . Alcohol use: Yes    Comment: 6 beers per month  Marital Status: Married   ROS:   Review of Systems  Constitutional: Negative for decreased appetite, malaise/fatigue, weight gain and weight loss.  Eyes: Negative for visual disturbance.  Cardiovascular: Negative for chest pain, claudication, dyspnea on exertion, leg swelling, near-syncope, orthopnea, palpitations, paroxysmal nocturnal dyspnea and syncope.  Respiratory: Negative for hemoptysis, shortness of breath and wheezing.   Endocrine: Negative for cold intolerance and heat intolerance.  Hematologic/Lymphatic: Does not bruise/bleed easily.  Skin: Negative for nail changes.  Musculoskeletal: Negative for muscle weakness and myalgias.  Gastrointestinal: Negative for abdominal pain, change in bowel habit, melena, nausea and vomiting.  Neurological: Negative for difficulty with concentration, dizziness, focal weakness, headaches and weakness.  Psychiatric/Behavioral: Negative for altered mental status and suicidal ideas.  All other systems reviewed and are negative.     Objective:  Blood pressure 130/90, pulse 84, temperature (!) 97.2 F (36.2 C), temperature source Temporal, resp. rate 16, height 6\' 1"  (1.854 m), weight 208 lb 6.4 oz (94.5 kg), SpO2 97 %. Body mass index is 27.5 kg/m.  Physical Exam Vitals reviewed.  Constitutional:      Appearance: He is well-developed.  HENT:     Head: Normocephalic and atraumatic.  Cardiovascular:     Rate and Rhythm: Normal rate and regular rhythm. Occasional extrasystoles are present.    Pulses: Intact distal pulses.     Heart sounds: Normal heart  sounds.  Pulmonary:     Effort: Pulmonary effort is normal. No accessory muscle usage or respiratory distress.     Breath sounds: Normal breath sounds.  Abdominal:     General: Bowel sounds are normal.     Palpations: Abdomen is soft.  Musculoskeletal:        General: Normal range of motion.     Cervical back: Normal range of motion.  Skin:    General: Skin is warm and dry.  Neurological:     Mental Status: He is alert and oriented to person, place, and time.    Laboratory examination:   CMP Latest Ref Rng & Units 01/11/2020  Glucose 70 - 99 mg/dL 350(K)  BUN 6 - 20 mg/dL 13  Creatinine 9.38 - 1.82 mg/dL 9.93  Sodium 716 - 967 mmol/L 137  Potassium 3.5 - 5.1 mmol/L 3.9  Chloride 98 - 111 mmol/L 103  CO2 22 - 32 mmol/L 23  Calcium 8.9 - 10.3 mg/dL 9.0   CBC Latest Ref Rng & Units 01/11/2020  WBC 4.0 - 10.5 K/uL 11.2(H)  Hemoglobin 13.0 - 17.0 g/dL 89.3  Hematocrit 81.0 - 52.0 % 48.4  Platelets 150 - 400 K/uL 288   Lipid Panel  No results found for: CHOL, TRIG, HDL, CHOLHDL, VLDL, LDLCALC, LDLDIRECT HEMOGLOBIN A1C No results found for: HGBA1C, MPG TSH No results for input(s): TSH in the last 8760 hours.  External labs:  None   Medications and Allergies:   Allergies  Allergen Reactions  . Flonase [Fluticasone] Other (See Comments)    migrianes  . Penicillins Hives   Current Outpatient Medications  Medication Instructions  . ADVAIR DISKUS 500-50 MCG/ACT AEPB INHALE 1 PUFF INTO THE LUNGS TWICE DAILY  . albuterol (VENTOLIN HFA) 108 (90 Base) MCG/ACT inhaler INHALE 2 PUFFS INTO THE LUNGS EVERY 6 (SIX) HOURS AS NEEDED FOR WHEEZING.  Marland Kitchen amLODipine (NORVASC) 10 MG tablet TAKE 1 TABLET(10 MG) BY MOUTH DAILY  . cetirizine (ZYRTEC) 5 mg, Oral, Daily  . hydrochlorothiazide (HYDRODIURIL) 25 mg, Oral, Daily  . montelukast (SINGULAIR) 10 MG tablet TAKE 1 TABLET(10 MG) BY MOUTH AT BEDTIME  . Multiple Vitamin (MULTIVITAMIN) tablet 1 tablet, Oral, Daily  . olmesartan (BENICAR)  20 mg, Oral, Daily  . omeprazole (PRILOSEC OTC) 20 mg, Oral, 2 times daily    Radiology:   No results found.  Cardiac Studies:   Echocardiogram 01/21/2020:  Normal LV systolic function with EF 60%. Left ventricle cavity is normal in size. Normal global wall motion. Normal diastolic filling pattern. Calculated EF 60%.  EKG   EKG 04/26/2021: Sinus rhythm at a rate of 74 bpm.  Normal axis.  No evidence of ischemia or underlying injury pattern.  Compared to EKG 01/13/2020, no significant change.  Assessment:   Primary hypertension - Plan: EKG 12-Lead, Basic metabolic panel  Family history of early CAD  Premature ventricular complex Meds ordered this encounter  Medications  . olmesartan (BENICAR) 20 MG tablet    Sig: Take 1 tablet (20 mg total) by mouth daily.    Dispense:  90 tablet    Refill:  3  . hydrochlorothiazide (HYDRODIURIL) 25 MG tablet  Sig: Take 1 tablet (25 mg total) by mouth daily.    Dispense:  90 tablet    Refill:  3   Medications Discontinued During This Encounter  Medication Reason  . FLUoxetine (PROZAC) 20 MG capsule Error  . olmesartan-hydrochlorothiazide (BENICAR HCT) 20-12.5 MG tablet Change in therapy    Recommendations:   Ronald Anderson  is a 35 y.o.  Caucasian male with asthma, acid reflux, hypertension, family history of premature CAD.  His mother has history of atrial fibrillation nonischemic cardiomyopathy.  Patient denies former tobacco use or illicit drug use.  He was originally referred to our office for evaluation of chest pain in January 2021.  He was last seen 01/13/2020 by Donnelly Stager who had advised 4-week follow-up of hypertension and cardiac testing results.  Patient was unfortunately lost to follow-up till now.  Patient now presents for follow-up with concerns of elevated blood pressure.  Patient is otherwise asymptomatic.  There are no recent labs available for review, however patient follows with PCP, in fact he had an appointment  yesterday, therefore will defer further laboratory evaluation to PCP particularly monitoring for hyperlipidemia.  In regard to hypertension, patient's blood pressure is mildly elevated above goal.  Discussed with patient option of making dietary and lifestyle modifications versus up titration of antihypertensive medication, shared decision was to proceed with increasing hydrochlorothiazide from 12.5 to 25 mg daily.  We will repeat BMP in 1 week.  Patient will continue to monitor his blood pressure on a daily basis at home and notify the office if it remains >130/80 mmHg.  In view of family history of premature coronary artery disease patient also wishes now to proceed with coronary calcium score.  I have placed orders.  She is otherwise stable from a cardiovascular standpoint.  Discussed with him regarding diet and lifestyle modifications to reduce overall cardiovascular risk, he verbalized understanding.  Follow-up in 1 year, sooner if needed, for hypertension and family history of premature CAD.   Ronald Halsted, PA-C 04/26/2021, 1:10 PM Office: [phone]

## 2021-04-26 ENCOUNTER — Ambulatory Visit (INDEPENDENT_AMBULATORY_CARE_PROVIDER_SITE_OTHER): Payer: 59 | Admitting: Student

## 2021-04-26 ENCOUNTER — Encounter: Payer: Self-pay | Admitting: Student

## 2021-04-26 ENCOUNTER — Other Ambulatory Visit: Payer: Self-pay

## 2021-04-26 VITALS — BP 130/90 | HR 84 | Temp 97.2°F | Resp 16 | Ht 73.0 in | Wt 208.4 lb

## 2021-04-26 DIAGNOSIS — I493 Ventricular premature depolarization: Secondary | ICD-10-CM

## 2021-04-26 DIAGNOSIS — Z8249 Family history of ischemic heart disease and other diseases of the circulatory system: Secondary | ICD-10-CM

## 2021-04-26 DIAGNOSIS — I10 Essential (primary) hypertension: Secondary | ICD-10-CM

## 2021-04-26 MED ORDER — OLMESARTAN MEDOXOMIL 20 MG PO TABS: 20.0000 mg | ORAL_TABLET | Freq: Every day | ORAL | 3 refills | Status: DC

## 2021-04-26 MED ORDER — HYDROCHLOROTHIAZIDE 25 MG PO TABS: 25.0000 mg | ORAL_TABLET | Freq: Every day | ORAL | 3 refills | Status: DC

## 2021-04-28 ENCOUNTER — Other Ambulatory Visit: Payer: Self-pay | Admitting: Student

## 2021-04-28 DIAGNOSIS — I10 Essential (primary) hypertension: Secondary | ICD-10-CM

## 2021-04-28 DIAGNOSIS — Z8249 Family history of ischemic heart disease and other diseases of the circulatory system: Secondary | ICD-10-CM

## 2021-05-05 IMAGING — DX DG CHEST 2V
2 series · 2 of 2 positions shown · non-contrast
Comparison: May 14, 2010

CLINICAL DATA: Chest pain.

EXAM:
CHEST - 2 VIEW

[chest pa]
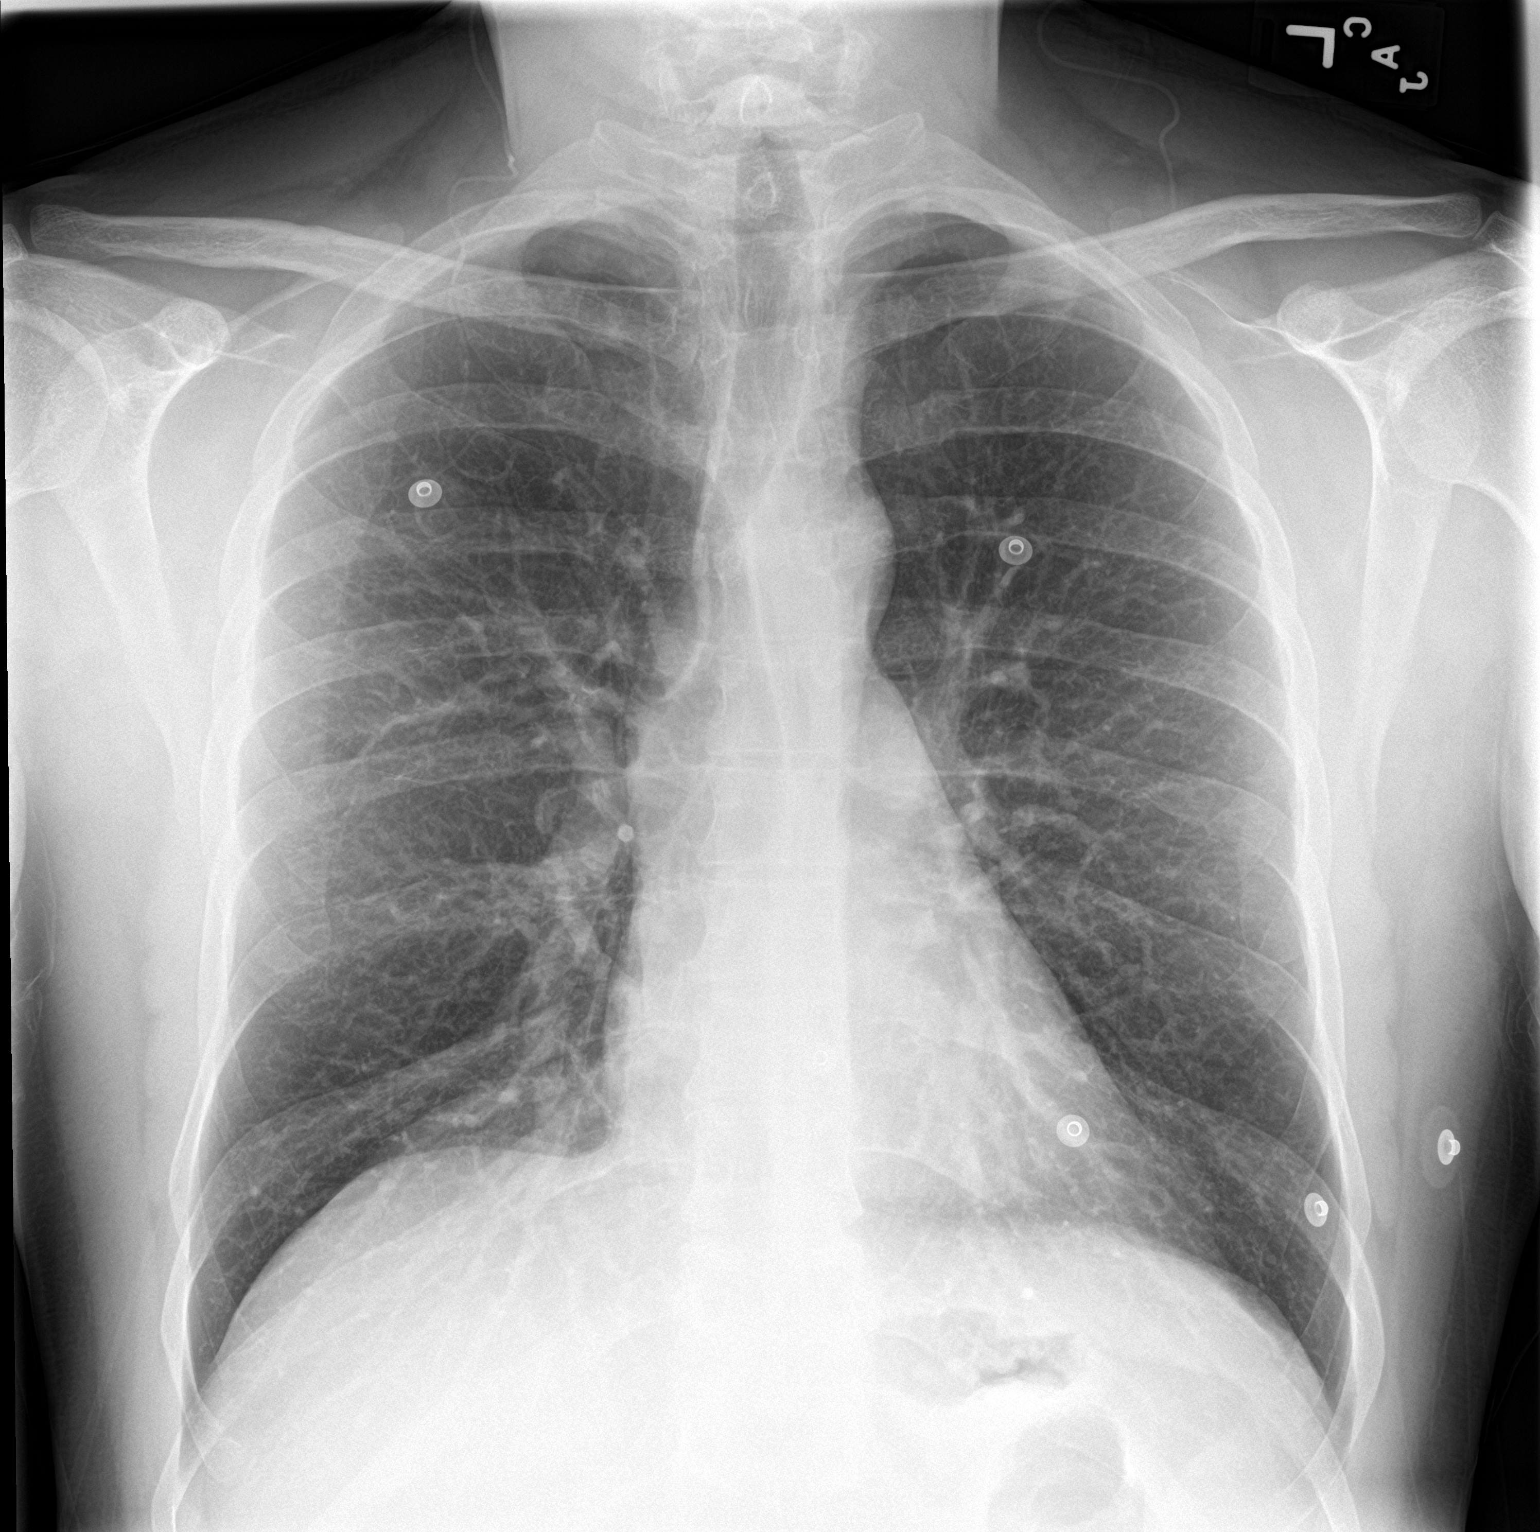

[chest lat]
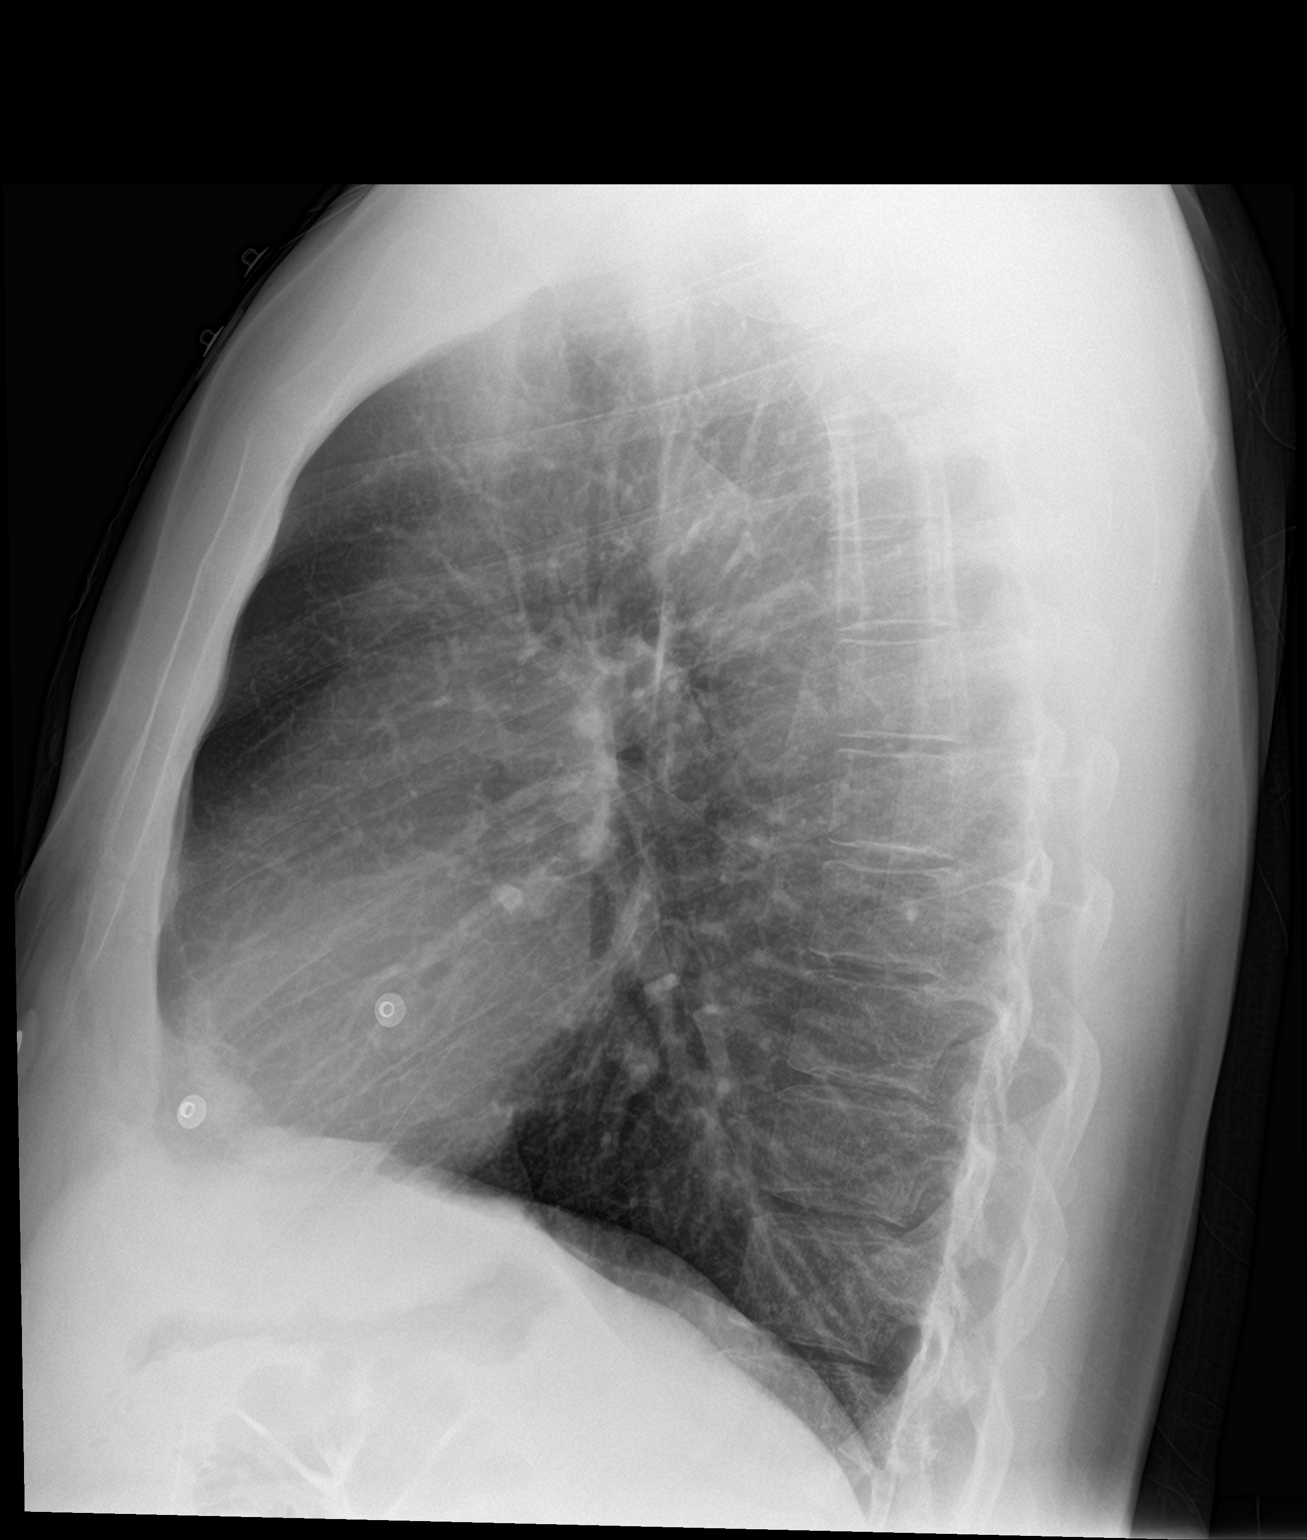

[2 of 2 positions shown; findings below may reference images not displayed]

FINDINGS: The heart size and mediastinal contours are within normal limits.
Both lungs are clear. The visualized skeletal structures are
unremarkable.
IMPRESSION: No active cardiopulmonary disease.

## 2021-05-06 LAB — BASIC METABOLIC PANEL
BUN/Creatinine Ratio: 15 (ref 9–20)
BUN: 21 mg/dL — ABNORMAL HIGH (ref 6–20)
CO2: 27 mmol/L (ref 20–29)
Calcium: 9.6 mg/dL (ref 8.7–10.2)
Chloride: 101 mmol/L (ref 96–106)
Creatinine, Ser: 1.41 mg/dL — ABNORMAL HIGH (ref 0.76–1.27)
Glucose: 75 mg/dL (ref 65–99)
Potassium: 4.4 mmol/L (ref 3.5–5.2)
Sodium: 141 mmol/L (ref 134–144)
eGFR: 66 mL/min/{1.73_m2} (ref >59)

## 2021-05-09 ENCOUNTER — Other Ambulatory Visit: Payer: Self-pay | Admitting: Student

## 2021-05-09 ENCOUNTER — Other Ambulatory Visit: Payer: Self-pay

## 2021-05-09 ENCOUNTER — Telehealth: Payer: Self-pay

## 2021-05-09 DIAGNOSIS — I10 Essential (primary) hypertension: Secondary | ICD-10-CM

## 2021-05-09 MED ORDER — HYDROCHLOROTHIAZIDE 12.5 MG PO CAPS: 12.5000 mg | ORAL_CAPSULE | Freq: Every day | ORAL | 3 refills | Status: DC

## 2021-05-09 MED ORDER — HYDROCHLOROTHIAZIDE 25 MG PO TABS: 12.5000 mg | ORAL_TABLET | Freq: Every day | ORAL | 3 refills | Status: DC

## 2021-05-09 NOTE — Progress Notes (Signed)
Please call patient and advise him that kidney function decreased.  He needs to reduce hydrochlorothiazide from 25 mg back to 12.5 mg daily. Drink plenty of water and repeat BMP in 2 weeks.

## 2021-05-09 NOTE — Progress Notes (Signed)
Pt mention he is having tight chest when getting up, has dizziness, and has nothing his b/p is high. It ranges from 138/97 pulse 76. [id number].

## 2021-05-09 NOTE — Telephone Encounter (Signed)
none

## 2021-05-10 ENCOUNTER — Telehealth: Payer: Self-pay | Admitting: Student

## 2021-05-10 MED ORDER — METOPROLOL SUCCINATE ER 25 MG PO TB24: 25.0000 mg | ORAL_TABLET | Freq: Every day | ORAL | 3 refills | Status: DC

## 2021-05-10 NOTE — Telephone Encounter (Signed)
Since increasing hydrochlorothiazide dose patient has noticed worsening heat sensitivity stating that he felt dizzy and lightheaded been outside for just a brief time in the heat.  Patient notes he drinks approximately a gallon of water per day.  Home blood pressure readings remain elevated despite increased hydrochlorothiazide averaging 137/95 mmHg with heart rate 75-85 bpm.  Repeat BMP with increased dose of hydrochlorothiazide revealed reduced renal function.  We will discontinue hydrochlorothiazide and start patient on metoprolol succinate 25 mg daily.  Patient will continue to monitor blood pressure and symptoms and notify our office of issues.

## 2021-05-17 ENCOUNTER — Other Ambulatory Visit: Payer: Self-pay | Admitting: Family Medicine

## 2021-05-17 ENCOUNTER — Other Ambulatory Visit: Payer: Self-pay | Admitting: Cardiology

## 2021-05-17 DIAGNOSIS — J4530 Mild persistent asthma, uncomplicated: Secondary | ICD-10-CM

## 2021-05-17 DIAGNOSIS — J309 Allergic rhinitis, unspecified: Secondary | ICD-10-CM

## 2021-06-28 ENCOUNTER — Encounter: Payer: Self-pay | Admitting: Family Medicine

## 2021-07-03 ENCOUNTER — Telehealth: Payer: Self-pay

## 2021-07-03 NOTE — Telephone Encounter (Signed)
Pt was advised that the sleep center has called three times. He was given their number to call and schedule. KH

## 2021-08-16 ENCOUNTER — Other Ambulatory Visit: Payer: Self-pay

## 2021-08-16 ENCOUNTER — Ambulatory Visit (HOSPITAL_BASED_OUTPATIENT_CLINIC_OR_DEPARTMENT_OTHER): Payer: 59 | Attending: Family Medicine | Admitting: Internal Medicine

## 2021-08-16 VITALS — Ht 72.0 in | Wt 215.0 lb

## 2021-08-16 DIAGNOSIS — R0683 Snoring: Secondary | ICD-10-CM | POA: Insufficient documentation

## 2021-08-16 DIAGNOSIS — G4733 Obstructive sleep apnea (adult) (pediatric): Secondary | ICD-10-CM | POA: Diagnosis present

## 2021-08-19 ENCOUNTER — Other Ambulatory Visit: Payer: Self-pay | Admitting: Family Medicine

## 2021-08-19 ENCOUNTER — Other Ambulatory Visit: Payer: Self-pay | Admitting: Cardiology

## 2021-08-19 DIAGNOSIS — J309 Allergic rhinitis, unspecified: Secondary | ICD-10-CM

## 2021-08-19 DIAGNOSIS — J4530 Mild persistent asthma, uncomplicated: Secondary | ICD-10-CM

## 2021-08-23 DIAGNOSIS — R0683 Snoring: Secondary | ICD-10-CM | POA: Diagnosis not present

## 2021-08-23 NOTE — Procedures (Signed)
   Patient Name: Ronald Anderson, Ronald Anderson Date: 08/16/2021 Gender: Male D.O.B: 05/20/1986 Age (years): 34 Referring Provider: [name] Height (inches): 72 Interpreting Physician: Jetty Duhamel MD, ABSM Weight (lbs): 215 RPSGT: Elaina Pattee BMI: 29 MRN: [id number] Neck Size: <br>  CLINICAL INFORMATION Sleep Study Type: HST Indication for sleep study: [place] Epworth Sleepiness Score: 10  SLEEP STUDY TECHNIQUE A multi-channel overnight portable sleep study was performed. The channels recorded were: nasal airflow, thoracic respiratory movement, and oxygen saturation with a pulse oximetry. Snoring was also monitored.  MEDICATIONS Patient self administered medications include: none reported  SLEEP ARCHITECTURE Patient was studied for 376.4 minutes. The sleep efficiency was 100.0 % and the patient was supine for 43.5%. The arousal index was 0.0 per hour.  RESPIRATORY PARAMETERS The overall AHI was 42.2 per hour, with a central apnea index of 0 per hour. The oxygen nadir was 86% during sleep.  CARDIAC DATA Mean heart rate during sleep was 65.1 bpm.  IMPRESSIONS - Severe obstructive sleep apnea occurred during this study (AHI = 42.2/h). - Oxygen desaturation was noted during this study (Min O2 = 86%). Mean O2 saturation 92%. - Patient snored.  DIAGNOSIS - Obstructive Sleep Apnea (G47.33)  RECOMMENDATIONS - Suggest CPAP titration or autopap. Other options would be based on clinical judgment. - Be carefull with alcohol, sedatives and other CNS depressants that may worsen sleep apnea and disrupt normal sleep architecture. - Sleep hygiene should be reviewed to assess factors that may improve sleep quality. - Weight management and regular exercise should be initiated or continued.  [Electronically signed] 08/23/2021 01:22 PM  Jetty Duhamel MD, ABSM Diplomate, American Board of Sleep Medicine   NPI: [id number]                          Jetty Duhamel Diplomate, American Board of Sleep Medicine  ELECTRONICALLY SIGNED ON:  08/23/2021, 1:19 PM [facility] PH: [phone]   FX: [phone] ACCREDITED BY THE AMERICAN ACADEMY OF SLEEP MEDICINE

## 2021-08-31 NOTE — Progress Notes (Signed)
Lvm for pt to call back to schedule a virtual visit to go over sleep study. KH

## 2021-09-01 NOTE — Progress Notes (Signed)
Done KH 

## 2021-09-04 ENCOUNTER — Other Ambulatory Visit: Payer: Self-pay

## 2021-09-04 ENCOUNTER — Encounter: Payer: Self-pay | Admitting: Family Medicine

## 2021-09-04 ENCOUNTER — Telehealth: Payer: 59 | Admitting: Family Medicine

## 2021-09-04 VITALS — BP 126/85 | Wt 210.0 lb

## 2021-09-04 DIAGNOSIS — J4530 Mild persistent asthma, uncomplicated: Secondary | ICD-10-CM

## 2021-09-04 DIAGNOSIS — G4733 Obstructive sleep apnea (adult) (pediatric): Secondary | ICD-10-CM

## 2021-09-04 DIAGNOSIS — J309 Allergic rhinitis, unspecified: Secondary | ICD-10-CM

## 2021-09-04 MED ORDER — AIRDUO DIGIHALER 232-14 MCG/ACT IN AEPB: 1.0000 "application " | INHALATION_SPRAY | Freq: Two times a day (BID) | RESPIRATORY_TRACT | 11 refills | Status: DC

## 2021-09-04 NOTE — Progress Notes (Signed)
   Subjective:    Patient ID: Ronald Anderson, male    DOB: 12/02/1986, 35 y.o.   MRN: [id number]  HPI Documentation for virtual audio and video telecommunications through [place] encounter: The patient was located at home. 2 patient identifiers used.  The provider was located in the office. The patient did consent to this visit and is aware of possible charges through their insurance for this visit. The other persons participating in this telemedicine service were none. Time spent on call was 7 minutes and in review of previous records >32 minutes total for counseling and coordination of care. This virtual service is not related to other E/M service within previous 7 days.  He recently had a sleep study done which did show severe OSA as well as some nocturnal hypoxia.  He also has underlying allergic rhinitis and asthma.  He states that he cannot breathe through his nose.  He has tried Flonase in the past but found it not as useful.  He continues on Zyrtec.  He is also taking Singulair.  He has an underlying history of asthma.  He states that his present inhaler is not adequately controlling his asthma.  Review of Systems     Objective:   Physical Exam Alert and in no distress otherwise not examined       Assessment & Plan:  Mild persistent asthma, unspecified whether complicated - Plan: Fluticasone-Salmeterol,sensor, (AIRDUO DIGIHALER) 232-14 MCG/ACT AEPB  OSA (obstructive sleep apnea) - Plan: For home use only DME continuous positive airway pressure (CPAP)  Allergic rhinitis, unspecified seasonality, unspecified trigger After discussion with him, I recommended he take Rhinocort nasal spray, continue on Zyrtec and Singulair.  I will switch him to air duo which apparently worked much better for him even though it is the same formulation as his Advair.  Continue to use the albuterol as needed.  I will set him up for the CPAP and explained that the nasal and pulmonary issues need to be  cleared up before the CPAP can be totally beneficial.  He is in agreement with this.  We will touch bases in about 2 weeks to see how he is doing with the new asthma medication as well as the Rhinocort/Zyrtec/Singulair.  Explained that there is another nasal medication that can be used.

## 2021-09-05 ENCOUNTER — Encounter: Payer: Self-pay | Admitting: Family Medicine

## 2021-09-19 ENCOUNTER — Telehealth: Payer: 59 | Admitting: Family Medicine

## 2021-09-19 ENCOUNTER — Other Ambulatory Visit: Payer: Self-pay

## 2021-09-19 VITALS — Ht 73.0 in | Wt 212.0 lb

## 2021-09-19 DIAGNOSIS — G4733 Obstructive sleep apnea (adult) (pediatric): Secondary | ICD-10-CM | POA: Diagnosis not present

## 2021-09-19 DIAGNOSIS — J309 Allergic rhinitis, unspecified: Secondary | ICD-10-CM | POA: Diagnosis not present

## 2021-09-19 DIAGNOSIS — J4530 Mild persistent asthma, uncomplicated: Secondary | ICD-10-CM

## 2021-09-19 NOTE — Progress Notes (Signed)
   Subjective:    Patient ID: Ronald Anderson, male    DOB: 1986-02-27, 35 y.o.   MRN: [id number]  HPI Documentation for virtual audio and video telecommunications through [place] encounter: The patient was located at home. 2 patient identifiers used.  The provider was located in the office. The patient did consent to this visit and is aware of possible charges through their insurance for this visit. The other persons participating in this telemedicine service were none. Time spent on call was 7 minutes and in review of previous records >17 minutes total for counseling and coordination of care. This virtual service is not related to other E/M service within previous 7 days.  Today's visit is to discuss his asthma.  He has tried Advair 50/500 and found that that was really not effective to keep his asthma under control requiring him to use albuterol.  I switched him to air duo to see if he would get better coverage with that and he again states that he has not had any effect on his asthma.  He continues on Rhinocort, Singulair and Zyrtec to help with his underlying allergies.  He is breathing better through his nose.  He also has OSA and is waiting on a CPAP.  Review of Systems     Objective:   Physical Exam Alert and in no distress otherwise not examined       Assessment & Plan:   Mild persistent asthma, unspecified whether complicated - Plan: Ambulatory referral to Pulmonology  OSA (obstructive sleep apnea)  Allergic rhinitis, unspecified seasonality, unspecified trigger He has not responded to the inhalers that have tried and I think is appropriate to get pulmonary to see him he might qualify for one of the newer treatments for asthma.

## 2021-10-05 ENCOUNTER — Telehealth: Payer: Self-pay | Admitting: Pulmonary Disease

## 2021-10-05 DIAGNOSIS — J45909 Unspecified asthma, uncomplicated: Secondary | ICD-10-CM

## 2021-10-05 NOTE — Telephone Encounter (Signed)
Patient scheduled asthma consult with Dr. Francine Graven for 10/17/21 at 1000.  Per asthma protocol Ige and cbc with diff ware needed within 1 year.  Attempted to call patient as appointment reminder and to arrange for labs to be drawn before scheduled OV.  LM to call back. Cbc and Ige orders pended.

## 2021-10-06 NOTE — Telephone Encounter (Signed)
Patient replied through my chart.  Labs ordered. Nothing further at this time.

## 2021-10-10 ENCOUNTER — Telehealth: Payer: Self-pay | Admitting: Internal Medicine

## 2021-10-12 ENCOUNTER — Other Ambulatory Visit (INDEPENDENT_AMBULATORY_CARE_PROVIDER_SITE_OTHER): Payer: 59

## 2021-10-12 DIAGNOSIS — J45909 Unspecified asthma, uncomplicated: Secondary | ICD-10-CM

## 2021-10-12 LAB — CBC WITH DIFFERENTIAL/PLATELET
Basophils Absolute: 0 10*3/uL (ref 0.0–0.1)
Basophils Relative: 0.5 % (ref 0.0–3.0)
Eosinophils Absolute: 0.2 10*3/uL (ref 0.0–0.7)
Eosinophils Relative: 2.8 % (ref 0.0–5.0)
HCT: 43.6 % (ref 39.0–52.0)
Hemoglobin: 15 g/dL (ref 13.0–17.0)
Lymphocytes Relative: 27.1 % (ref 12.0–46.0)
Lymphs Abs: 1.9 10*3/uL (ref 0.7–4.0)
MCHC: 34.3 g/dL (ref 30.0–36.0)
MCV: 92.8 fl (ref 78.0–100.0)
Monocytes Absolute: 0.4 10*3/uL (ref 0.1–1.0)
Monocytes Relative: 5.7 % (ref 3.0–12.0)
Neutro Abs: 4.5 10*3/uL (ref 1.4–7.7)
Neutrophils Relative %: 63.9 % (ref 43.0–77.0)
Platelets: 253 10*3/uL (ref 150.0–400.0)
RBC: 4.7 Mil/uL (ref 4.22–5.81)
RDW: 12.5 % (ref 11.5–15.5)
WBC: 7.1 10*3/uL (ref 4.0–10.5)

## 2021-10-13 LAB — IGE: IgE (Immunoglobulin E), Serum: 59 kU/L (ref <=114)

## 2021-10-17 ENCOUNTER — Institutional Professional Consult (permissible substitution): Payer: 59 | Admitting: Pulmonary Disease

## 2021-10-17 ENCOUNTER — Other Ambulatory Visit: Payer: Self-pay

## 2021-10-17 ENCOUNTER — Encounter: Payer: Self-pay | Admitting: Pulmonary Disease

## 2021-10-17 ENCOUNTER — Telehealth: Payer: Self-pay | Admitting: Pulmonary Disease

## 2021-10-17 ENCOUNTER — Ambulatory Visit (INDEPENDENT_AMBULATORY_CARE_PROVIDER_SITE_OTHER): Payer: 59 | Admitting: Pulmonary Disease

## 2021-10-17 VITALS — BP 118/68 | HR 76 | Temp 98.8°F | Ht 72.0 in | Wt 203.0 lb

## 2021-10-17 DIAGNOSIS — J4540 Moderate persistent asthma, uncomplicated: Secondary | ICD-10-CM

## 2021-10-17 MED ORDER — BREZTRI AEROSPHERE 160-9-4.8 MCG/ACT IN AERO: 2.0000 | INHALATION_SPRAY | Freq: Two times a day (BID) | RESPIRATORY_TRACT | 0 refills | Status: DC

## 2021-10-17 MED ORDER — TRELEGY ELLIPTA 100-62.5-25 MCG/ACT IN AEPB: 1.0000 | INHALATION_SPRAY | Freq: Every day | RESPIRATORY_TRACT | 0 refills | Status: DC

## 2021-10-17 MED ORDER — PREDNISONE 20 MG PO TABS: 40.0000 mg | ORAL_TABLET | Freq: Every day | ORAL | 0 refills | Status: AC

## 2021-10-17 MED ORDER — TRELEGY ELLIPTA 200-62.5-25 MCG/ACT IN AEPB: 1.0000 | INHALATION_SPRAY | Freq: Every day | RESPIRATORY_TRACT | 3 refills | Status: DC

## 2021-10-17 NOTE — Patient Instructions (Addendum)
Nice to meet you  Use Trelegy one puff daily  Stop airduo  Take prednisone 40 mg daily for 5 days  We may need to use biologics if not improving in the coming weeks  Return to clinic in 6 -7 weeks with Dr. Judeth Horn

## 2021-10-17 NOTE — Telephone Encounter (Signed)
Received paperwork from MH's inbox. Will complete the forms and fax to [employer].

## 2021-10-17 NOTE — Addendum Note (Signed)
Addended by: Carolanne Grumbling, Elane Fritz L on: 10/17/2021 11:19 AM   Modules accepted: Orders

## 2021-10-17 NOTE — Progress Notes (Signed)
@Patient  ID: , male    DOB: Jan 28, 1986, 35 y.o.   MRN: [id number]  Chief Complaint  Patient presents with   Consult    Uncontrolled asthma    Referring provider: [id number], MD  HPI:   35 y.o. man whom we are seeing in consultation for evaluation of dyspnea on exertion and asthma.  PCP note x2 reviewed.  Polysomnography results reviewed.  Longstanding history of asthma.  Diagnosed many years ago.  Had a relatively stable on high-dose Advair discus.  However last several years has had progressive worsening of symptoms.  Does not like it there is working.  Discretion is dyspnea, cough with exertion.  Leads to wheezing.  Can use albuterol with gradual improvement but not 200%.  Overall worsening the last several years.  Recently tried air duo high-dose without improvement.  No inciting event or trigger to the symptoms.  No other relieving or exacerbating factors.  No time of day when things are better or worse.  No positions that make things better or worse. No seasonal or environmental factors that he can find to make things better or worse.  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  Dogs in the house.  Antihistamines greatly improved the symptoms.  Reviewed echocardiogram 12/2020 that is grossly normal.  Review chest x-ray 01/11/2020 on my review and interpretation reveals mild hyperinflation without infiltrate or abnormality otherwise.  Reviewed prior most recent chest x-ray 2011 on my review and interpretation reveals similar hyperinflation, more soft tissue recently compared to 2011.  reviewed recent lab work 10/22 with eosinophils 200, IgE 59.  PMH: Asthma, hypertension Surgical history: Wisdom tooth extraction Family history: CAD in mother, father with CAD, stroke, hypertension Social history: Never smoker, lives in [place] / Pulmonary Flowsheets:   ACT:  Asthma Control Test ACT Total Score  10/17/2021 9     MMRC: No flowsheet data found.  Epworth:  No flowsheet data found.  Tests:   FENO:  No results found for: NITRICOXIDE  PFT: No flowsheet data found.  WALK:  No flowsheet data found.  Imaging: Personally reviewed and as per EMR discussion of this note  Lab Results: Personally reviewed CBC    Component Value Date/Time   WBC 7.1 10/12/2021 1636   RBC 4.70 10/12/2021 1636   HGB 15.0 10/12/2021 1636   HCT 43.6 10/12/2021 1636   PLT 253.0 10/12/2021 1636   MCV 92.8 10/12/2021 1636   MCH 32.1 01/11/2020 0121   MCHC 34.3 10/12/2021 1636   RDW 12.5 10/12/2021 1636   LYMPHSABS 1.9 10/12/2021 1636   MONOABS 0.4 10/12/2021 1636   EOSABS 0.2 10/12/2021 1636   BASOSABS 0.0 10/12/2021 1636    BMET    Component Value Date/Time   NA 141 05/05/2021 1141   K 4.4 05/05/2021 1141   CL 101 05/05/2021 1141   CO2 27 05/05/2021 1141   GLUCOSE 75 05/05/2021 1141   GLUCOSE 114 (H) 01/11/2020 0121   BUN 21 (H) 05/05/2021 1141   CREATININE 1.41 (H) 05/05/2021 1141   CALCIUM 9.6 05/05/2021 1141   GFRNONAA >60 01/11/2020 0121   GFRAA >60 01/11/2020 0121    BNP No results found for: BNP  ProBNP No results found for: PROBNP  Specialty Problems       Pulmonary Problems   Asthma   Allergic rhinitis   OSA (obstructive sleep apnea)    Allergies  Allergen Reactions   Flonase [Fluticasone] Other (See Comments)  migrianes   Penicillins Hives    Immunization History  Administered Date(s) Administered   Influenza Split 09/28/2014   Influenza,inj,Quad PF,6+ Mos 10/13/2017   Influenza-Unspecified 09/22/2014, 10/31/2016, 11/23/2018, 12/25/2019   Moderna SARS-COV2 Booster Vaccination 04/24/2021   Moderna Sars-Covid-2 Vaccination 07/11/2020, 08/08/2020   Tdap 12/24/2010, 04/24/2021    Past Medical History:  Diagnosis Date   Allergic rhinitis    Asthma    Dysthymia    GERD (gastroesophageal reflux disease)     Tobacco History: Social History   Tobacco Use   Smoking Status Never  Smokeless Tobacco Never   Counseling given: Not Answered   Continue to not smoke  Outpatient Encounter Medications as of 10/17/2021  Medication Sig   albuterol (VENTOLIN HFA) 108 (90 Base) MCG/ACT inhaler INHALE 2 PUFFS INTO THE LUNGS EVERY 6 (SIX) HOURS AS NEEDED FOR WHEEZING.   amLODipine (NORVASC) 10 MG tablet TAKE 1 TABLET(10 MG) BY MOUTH DAILY   cetirizine (ZYRTEC) 5 MG tablet Take 5 mg by mouth daily.   FLUoxetine (PROZAC) 40 MG capsule 40 mg.   Fluticasone-Umeclidin-Vilant (TRELEGY ELLIPTA) 200-62.5-25 MCG/ACT AEPB Inhale 1 puff into the lungs daily.   gabapentin (NEURONTIN) 100 MG capsule Take 100 mg by mouth.   hydrochlorothiazide (MICROZIDE) 12.5 MG capsule Take 12.5 mg by mouth daily.   metoprolol succinate (TOPROL-XL) 25 MG 24 hr tablet Take 1 tablet (25 mg total) by mouth daily. Take with or immediately following a meal.   montelukast (SINGULAIR) 10 MG tablet TAKE 1 TABLET(10 MG) BY MOUTH AT BEDTIME   Multiple Vitamin (MULTIVITAMIN) tablet Take 1 tablet by mouth daily.   olmesartan (BENICAR) 20 MG tablet Take 1 tablet (20 mg total) by mouth daily.   omeprazole (PRILOSEC OTC) 20 MG tablet Take 1 tablet (20 mg total) by mouth 2 (two) times daily.   predniSONE (DELTASONE) 20 MG tablet Take 2 tablets (40 mg total) by mouth daily with breakfast for 5 days.   [DISCONTINUED] Fluticasone-Salmeterol,sensor, (AIRDUO DIGIHALER) 232-14 MCG/ACT AEPB Inhale 1 application into the lungs 2 (two) times daily.   buPROPion (WELLBUTRIN XL) 150 MG 24 hr tablet Take 150 mg by mouth every morning. (Patient not taking: Reported on 10/17/2021)   No facility-administered encounter medications on file as of 10/17/2021.     Review of Systems  Review of Systems  No chest pain with exertion.  No orthopnea or PND.  No lower extremity swelling.  Comprehensive review of systems otherwise negative. Physical Exam  BP 118/68 (BP Location: Right Arm, Cuff Size: Normal)   Pulse 76    Temp 98.8 F (37.1 C)   Ht 6' (1.829 m)   Wt 203 lb (92.1 kg)   SpO2 94%   BMI 27.53 kg/m   Wt Readings from Last 5 Encounters:  10/17/21 203 lb (92.1 kg)  09/19/21 212 lb (96.2 kg)  09/04/21 210 lb (95.3 kg)  08/16/21 215 lb (97.5 kg)  04/26/21 208 lb 6.4 oz (94.5 kg)    BMI Readings from Last 5 Encounters:  10/17/21 27.53 kg/m  09/19/21 27.97 kg/m  09/04/21 28.48 kg/m  08/16/21 29.16 kg/m  04/26/21 27.50 kg/m     Physical Exam General: Well-appearing, no acute distress Eyes: EOMI, no icterus Neck: Supple, no JVP Pulmonary: Distant, clear, normal work of breathing, no wheeze Cardiovascular: Regular rate and rhythm, no murmur Abdomen: Nondistended, bowel sounds present MSK: No synovitis, no joint effusion Neuro: Normal gait, no weakness Psych: Normal mood, full affect   Assessment & Plan:   Dyspnea, cough: Suspect  related to poorly controlled asthma.  Chest imaging clear with exception of hyper inflation.  High-dose ICS/LABA without improvement.  Prior spirometry with fixed obstruction 2018.  Asthma: Poorly controlled.  On high-dose ICS/LABA.  Escalate to Trelegy high-dose.  Prednisone 40 mg a day x5 days.  If not improving in the coming weeks anticipate need for biologic.  IgE around 50.  Eos 200.  Nucala vs Tezspire.  Discussed risk and benefits and need for EpiPen.   Return in about 7 weeks (around 12/04/2021).   Karren Burly, MD 10/17/2021

## 2021-10-19 MED ORDER — TRELEGY ELLIPTA 200-62.5-25 MCG/ACT IN AEPB: 1.0000 | INHALATION_SPRAY | Freq: Every day | RESPIRATORY_TRACT | 3 refills | Status: DC

## 2021-11-09 NOTE — Telephone Encounter (Signed)
[name], do you have an update on this? Thanks!

## 2021-11-10 NOTE — Telephone Encounter (Signed)
Forms were faxed on 10/30/21. Forms will remain in MH's cubby until 11/29/21 before sending to scan.

## 2021-11-22 ENCOUNTER — Other Ambulatory Visit: Payer: Self-pay | Admitting: Family Medicine

## 2021-11-22 DIAGNOSIS — J4530 Mild persistent asthma, uncomplicated: Secondary | ICD-10-CM

## 2021-11-22 DIAGNOSIS — J309 Allergic rhinitis, unspecified: Secondary | ICD-10-CM

## 2021-11-30 ENCOUNTER — Other Ambulatory Visit: Payer: Self-pay | Admitting: Family Medicine

## 2021-11-30 DIAGNOSIS — J4530 Mild persistent asthma, uncomplicated: Secondary | ICD-10-CM

## 2021-11-30 DIAGNOSIS — J309 Allergic rhinitis, unspecified: Secondary | ICD-10-CM

## 2021-12-01 MED ORDER — MONTELUKAST SODIUM 10 MG PO TABS: 10.0000 mg | ORAL_TABLET | Freq: Every day | ORAL | 0 refills | Status: DC

## 2021-12-01 MED ORDER — MONTELUKAST SODIUM 10 MG PO TABS
10.0000 mg | ORAL_TABLET | Freq: Every day | ORAL | 0 refills | Status: DC
Start: 2021-12-01 — End: 2022-02-28

## 2021-12-01 NOTE — Addendum Note (Signed)
Addended by: Fulton Reek A on: 12/01/2021 07:55 AM   Modules accepted: Orders

## 2021-12-04 ENCOUNTER — Telehealth: Payer: Self-pay | Admitting: Pharmacist

## 2021-12-04 ENCOUNTER — Other Ambulatory Visit: Payer: Self-pay

## 2021-12-04 ENCOUNTER — Ambulatory Visit (INDEPENDENT_AMBULATORY_CARE_PROVIDER_SITE_OTHER): Payer: 59 | Admitting: Pulmonary Disease

## 2021-12-04 ENCOUNTER — Encounter: Payer: Self-pay | Admitting: Pulmonary Disease

## 2021-12-04 VITALS — BP 128/68 | HR 66 | Temp 97.9°F | Ht 72.0 in | Wt 201.0 lb

## 2021-12-04 DIAGNOSIS — J4540 Moderate persistent asthma, uncomplicated: Secondary | ICD-10-CM

## 2021-12-04 DIAGNOSIS — G4733 Obstructive sleep apnea (adult) (pediatric): Secondary | ICD-10-CM | POA: Diagnosis not present

## 2021-12-04 DIAGNOSIS — Z5181 Encounter for therapeutic drug level monitoring: Secondary | ICD-10-CM

## 2021-12-04 MED ORDER — BREZTRI AEROSPHERE 160-9-4.8 MCG/ACT IN AERO: 2.0000 | INHALATION_SPRAY | Freq: Two times a day (BID) | RESPIRATORY_TRACT | 0 refills | Status: DC

## 2021-12-04 NOTE — Progress Notes (Signed)
@Patient  ID: , male    DOB: March 15, 1986, 35 y.o.   MRN: [id number]  Chief Complaint  Patient presents with   Follow-up    Pt states that asthma is not good and wants to talk to you about different inhalers     Referring provider: [id number], MD  HPI:   35 y.o. man whom we are seeing in follow up  for evaluation of dyspnea on exertion and asthma.    Doing ok. Breztri samples helped.  Benefits of triple inhaled therapy seen but waned over the last couple weeks.  Symptoms still not adequately controlled.  Still requiring albuterol rescue inhaler when exerting himself, particularly when playing in the yard with his son.  This is a near daily occurrence.  Albuterol does provide adequate relief.  Had wanted to trial Trelegy at last visit.  Unfortunately many factors visit paperwork is had a snack.  Unclear where the hold-up is.  Discussed will troubleshoot with pharmacy team today.  Given his ongoing symptoms despite several weeks of triple inhaled therapy, discussed biologic therapy is justified.  Eosinophils 200 on last check.  Plan to forward with Nucala.  HPI at initial visit:  Longstanding history of asthma.  Diagnosed many years ago.  Had a relatively stable on high-dose Advair discus.  However last several years has had progressive worsening of symptoms.  Does not like it there is working.  Discretion is dyspnea, cough with exertion.  Leads to wheezing.  Can use albuterol with gradual improvement but not 200%.  Overall worsening the last several years.  Recently tried air duo high-dose without improvement.  No inciting event or trigger to the symptoms.  No other relieving or exacerbating factors.  No time of day when things are better or worse.  No positions that make things better or worse. No seasonal or environmental factors that he can find to make things better or worse.  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  Dogs in the house.   Antihistamines greatly improved the symptoms.  Reviewed echocardiogram 12/2020 that is grossly normal.  Review chest x-ray 01/11/2020 on my review and interpretation reveals mild hyperinflation without infiltrate or abnormality otherwise.  Reviewed prior most recent chest x-ray 2011 on my review and interpretation reveals similar hyperinflation, more soft tissue recently compared to 2011.  reviewed recent lab work 10/22 with eosinophils 200, IgE 59.  PMH: Asthma, hypertension Surgical history: Wisdom tooth extraction Family history: CAD in mother, father with CAD, stroke, hypertension Social history: Never smoker, lives in [place]  Questionaires / Pulmonary Flowsheets:   ACT:  Asthma Control Test ACT Total Score  12/04/2021 17  10/17/2021 9    MMRC: No flowsheet data found.  Epworth:  No flowsheet data found.  Tests:   FENO:  No results found for: NITRICOXIDE  PFT: No flowsheet data found.  WALK:  No flowsheet data found.  Imaging: Personally reviewed and as per EMR discussion of this note  Lab Results: Personally reviewed CBC    Component Value Date/Time   WBC 7.1 10/12/2021 1636   RBC 4.70 10/12/2021 1636   HGB 15.0 10/12/2021 1636   HCT 43.6 10/12/2021 1636   PLT 253.0 10/12/2021 1636   MCV 92.8 10/12/2021 1636   MCH 32.1 01/11/2020 0121   MCHC 34.3 10/12/2021 1636   RDW 12.5 10/12/2021 1636   LYMPHSABS 1.9 10/12/2021 1636   MONOABS 0.4 10/12/2021 1636   EOSABS 0.2 10/12/2021 1636  BASOSABS 0.0 10/12/2021 1636    BMET    Component Value Date/Time   NA 141 05/05/2021 1141   K 4.4 05/05/2021 1141   CL 101 05/05/2021 1141   CO2 27 05/05/2021 1141   GLUCOSE 75 05/05/2021 1141   GLUCOSE 114 (H) 01/11/2020 0121   BUN 21 (H) 05/05/2021 1141   CREATININE 1.41 (H) 05/05/2021 1141   CALCIUM 9.6 05/05/2021 1141   GFRNONAA >60 01/11/2020 0121   GFRAA >60 01/11/2020 0121    BNP No results found for: BNP  ProBNP No results found for:  PROBNP  Specialty Problems       Pulmonary Problems   Asthma   Allergic rhinitis   OSA (obstructive sleep apnea)    Allergies  Allergen Reactions   Flonase [Fluticasone] Other (See Comments)    migrianes   Penicillins Hives    Immunization History  Administered Date(s) Administered   Influenza Split 09/28/2014   Influenza,inj,Quad PF,6+ Mos 10/13/2017   Influenza-Unspecified 09/22/2014, 10/31/2016, 11/23/2018, 12/25/2019   Moderna SARS-COV2 Booster Vaccination 04/24/2021   Moderna Sars-Covid-2 Vaccination 07/11/2020, 08/08/2020   Tdap 12/24/2010, 04/24/2021    Past Medical History:  Diagnosis Date   Allergic rhinitis    Asthma    Dysthymia    GERD (gastroesophageal reflux disease)     Tobacco History: Social History   Tobacco Use  Smoking Status Never  Smokeless Tobacco Never   Counseling given: Not Answered   Continue to not smoke  Outpatient Encounter Medications as of 12/04/2021  Medication Sig   ADDERALL XR 15 MG 24 hr capsule Take by mouth every morning.   albuterol (VENTOLIN HFA) 108 (90 Base) MCG/ACT inhaler INHALE 2 PUFFS INTO THE LUNGS EVERY 6 (SIX) HOURS AS NEEDED FOR WHEEZING.   amLODipine (NORVASC) 10 MG tablet TAKE 1 TABLET(10 MG) BY MOUTH DAILY   Budeson-Glycopyrrol-Formoterol (BREZTRI AEROSPHERE) 160-9-4.8 MCG/ACT AERO Inhale 2 puffs into the lungs in the morning and at bedtime.   Budeson-Glycopyrrol-Formoterol (BREZTRI AEROSPHERE) 160-9-4.8 MCG/ACT AERO Inhale 2 puffs into the lungs in the morning and at bedtime.   buPROPion (WELLBUTRIN XL) 150 MG 24 hr tablet Take 150 mg by mouth every morning.   cetirizine (ZYRTEC) 5 MG tablet Take 5 mg by mouth daily.   FLUoxetine (PROZAC) 40 MG capsule 40 mg.   Fluticasone-Umeclidin-Vilant (TRELEGY ELLIPTA) 200-62.5-25 MCG/ACT AEPB Inhale 1 puff into the lungs daily.   gabapentin (NEURONTIN) 100 MG capsule Take 300 mg by mouth.   hydrochlorothiazide (MICROZIDE) 12.5 MG capsule Take 12.5 mg by mouth  daily.   metoprolol succinate (TOPROL-XL) 25 MG 24 hr tablet Take 1 tablet (25 mg total) by mouth daily. Take with or immediately following a meal.   montelukast (SINGULAIR) 10 MG tablet Take 1 tablet (10 mg total) by mouth at bedtime. TAKE 1 TABLET(10 MG) BY MOUTH AT BEDTIME Strength: 10 mg   Multiple Vitamin (MULTIVITAMIN) tablet Take 1 tablet by mouth daily.   olmesartan (BENICAR) 20 MG tablet Take 1 tablet (20 mg total) by mouth daily.   omeprazole (PRILOSEC OTC) 20 MG tablet Take 1 tablet (20 mg total) by mouth 2 (two) times daily.   No facility-administered encounter medications on file as of 12/04/2021.     Review of Systems  Review of Systems  N/a Physical Exam  BP 128/68 (BP Location: Left Arm, Patient Position: Sitting, Cuff Size: Normal)   Pulse 66   Temp 97.9 F (36.6 C) (Oral)   Ht 6' (1.829 m)   Wt 201 lb (91.2 kg)  SpO2 99%   BMI 27.26 kg/m   Wt Readings from Last 5 Encounters:  12/04/21 201 lb (91.2 kg)  10/17/21 203 lb (92.1 kg)  09/19/21 212 lb (96.2 kg)  09/04/21 210 lb (95.3 kg)  08/16/21 215 lb (97.5 kg)    BMI Readings from Last 5 Encounters:  12/04/21 27.26 kg/m  10/17/21 27.53 kg/m  09/19/21 27.97 kg/m  09/04/21 28.48 kg/m  08/16/21 29.16 kg/m     Physical Exam General: Well-appearing, no acute distress Eyes: EOMI, no icterus Neck: Supple, no JVP Pulmonary: Distant, clear, normal work of breathing, no wheeze Cardiovascular: Regular rate and rhythm, no murmur MSK: No synovitis, no joint effusion Neuro: Normal gait, no weakness Psych: Normal mood, full affect   Assessment & Plan:   Dyspnea, cough: Suspect related to poorly controlled asthma.  Chest imaging clear with exception of hyper inflation.  High-dose ICS/LABA without improvement.  Prior spirometry with fixed obstruction 2018.  Mild improvement with triple inhaled therapy via Breztri.  Asthma: Overall a bit better controlled but realistically poorly controlled given daily  albuterol use.  On Breztri.  IgE around 50.  Eos 200.  Plan for Nucala given not adequate control despite triple inhaled therapy.  Discussed risk and benefits and need for EpiPen.  He is eager to move forward.  Additional samples of Breztri provided today.  Would like to eventually get him on Trelegy as a trial.  If cough continues to be an issue, co-pays for Spiriva and air duo likely the best financial option if single inhaler is not an option.   Return in about 2 months (around 02/04/2022).   Karren Burly, MD 12/04/2021

## 2021-12-04 NOTE — Telephone Encounter (Signed)
-----   Message from Karren Burly, MD sent at 12/04/2021 10:27 AM EST ----- Paperwork today for new start [facility]

## 2021-12-04 NOTE — Telephone Encounter (Addendum)
Please start Nucala BIV.  Dose: 100mg  SQ every 4 weeks  Dx: Moderate persistent asthma (J45.50)  Patient is commercially insured so should be eligible for copay card  , PharmD, MPH, BCPS Clinical Pharmacist (Rheumatology and Pulmonology)  ----- Message from Chesley Mires, MD sent at 12/04/2021 10:27 AM EST ----- Paperwork today for new start [facility]

## 2021-12-04 NOTE — Patient Instructions (Addendum)
Nice to see you again   We will start paperwork for Nucala  Use Breztri samples   I will follow up regarding the Trelegy  Back up plan will be Spiriva and Airduo if we struggle with the above inhalers  Return to clinic in 2 months or sooner as needed

## 2021-12-04 NOTE — Telephone Encounter (Signed)
Submitted a Prior Authorization request to [facility] for NUCALA via CoverMyMeds. Will update once we receive a response.   Key: [id number]

## 2021-12-04 NOTE — Telephone Encounter (Signed)
Error

## 2021-12-04 NOTE — Progress Notes (Signed)
Nucala benefits investigation started in separate telephone encounter  Ronald Anderson, PharmD, MPH, BCPS Clinical Pharmacist (Rheumatology and Pulmonology)

## 2021-12-06 NOTE — Telephone Encounter (Signed)
Received a fax regarding Prior Authorization from [facility] for NUCALA. 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  (II) You are currently dependent on maintenance therapy with oral corticosteroids for the treatment of asthma.  Information pertaining to both qualifying points are found in the first paragraph of the chart notes that were provided at the time of submission.  Phone# [phone]

## 2021-12-07 ENCOUNTER — Other Ambulatory Visit: Payer: Self-pay | Admitting: Cardiology

## 2021-12-07 NOTE — Telephone Encounter (Addendum)
Submitted an URGENT appeal to [facility] for NUCALA.  Reference # [id number] Phone: [phone] Fax: [phone]  Chesley Mires, PharmD, MPH, BCPS Clinical Pharmacist (Rheumatology and Pulmonology)

## 2021-12-08 MED ORDER — AMLODIPINE BESYLATE 10 MG PO TABS: ORAL_TABLET | ORAL | 0 refills | Status: DC

## 2021-12-26 NOTE — Telephone Encounter (Addendum)
Received letter from [facility] that [place] denial is upheld. Reviewed by MD.  They require baseline eosinophil >150 within past 6 weeks and patient must be oral steroid dependent.  Left VM with patient requesting he return to clinic for repeat lab. Future lab order placed for CBC w diff  Knox Saliva, PharmD, MPH, BCPS Clinical Pharmacist (Rheumatology and Pulmonology)

## 2021-12-29 NOTE — Telephone Encounter (Signed)
ATC patient regarding Nucala authorization. Unable to reach - left VM stating he can stop by our clinic to have CBC repeated  Chesley Mires, PharmD, MPH, BCPS Clinical Pharmacist (Rheumatology and Pulmonology)

## 2022-01-03 NOTE — Telephone Encounter (Signed)
ATC #3 to call patient to review Nucala denial. Advised in VM that lab order is placed and he can stop by clinic to have lab completed.  Will f/u one more time before closing encounter  Ronald Anderson, PharmD, MPH, BCPS Clinical Pharmacist (Rheumatology and Pulmonology)

## 2022-01-09 NOTE — Telephone Encounter (Signed)
ATC patient regarding Nucala new start. VM box is full. MyChart message sent.  Chesley Mires, PharmD, MPH, BCPS Clinical Pharmacist (Rheumatology and Pulmonology)

## 2022-02-04 ENCOUNTER — Emergency Department (HOSPITAL_COMMUNITY)
Admission: EM | Admit: 2022-02-04 | Discharge: 2022-02-05 | Disposition: A | Discharge status: Home / Self Care | Payer: 59 | Source: Home / Self Care | Attending: Emergency Medicine | Admitting: Emergency Medicine

## 2022-02-04 ENCOUNTER — Other Ambulatory Visit: Payer: Self-pay

## 2022-02-04 ENCOUNTER — Encounter (HOSPITAL_COMMUNITY): Payer: Self-pay

## 2022-02-04 ENCOUNTER — Emergency Department (HOSPITAL_COMMUNITY)
Admission: EM | Admit: 2022-02-04 | Discharge: 2022-02-04 | Disposition: A | Discharge status: Home / Self Care | Payer: 59 | Attending: Emergency Medicine | Admitting: Emergency Medicine

## 2022-02-04 ENCOUNTER — Encounter (HOSPITAL_COMMUNITY): Payer: Self-pay | Admitting: Emergency Medicine

## 2022-02-04 DIAGNOSIS — R112 Nausea with vomiting, unspecified: Secondary | ICD-10-CM

## 2022-02-04 DIAGNOSIS — Z79899 Other long term (current) drug therapy: Secondary | ICD-10-CM | POA: Insufficient documentation

## 2022-02-04 DIAGNOSIS — I10 Essential (primary) hypertension: Secondary | ICD-10-CM | POA: Insufficient documentation

## 2022-02-04 DIAGNOSIS — J45909 Unspecified asthma, uncomplicated: Secondary | ICD-10-CM | POA: Insufficient documentation

## 2022-02-04 DIAGNOSIS — Z20822 Contact with and (suspected) exposure to covid-19: Secondary | ICD-10-CM | POA: Diagnosis not present

## 2022-02-04 DIAGNOSIS — R197 Diarrhea, unspecified: Secondary | ICD-10-CM | POA: Insufficient documentation

## 2022-02-04 DIAGNOSIS — R531 Weakness: Secondary | ICD-10-CM | POA: Insufficient documentation

## 2022-02-04 DIAGNOSIS — R42 Dizziness and giddiness: Secondary | ICD-10-CM | POA: Diagnosis present

## 2022-02-04 DIAGNOSIS — R61 Generalized hyperhidrosis: Secondary | ICD-10-CM | POA: Insufficient documentation

## 2022-02-04 DIAGNOSIS — Z5321 Procedure and treatment not carried out due to patient leaving prior to being seen by health care provider: Secondary | ICD-10-CM | POA: Insufficient documentation

## 2022-02-04 DIAGNOSIS — R509 Fever, unspecified: Secondary | ICD-10-CM | POA: Insufficient documentation

## 2022-02-04 DIAGNOSIS — R6883 Chills (without fever): Secondary | ICD-10-CM | POA: Insufficient documentation

## 2022-02-04 HISTORY — DX: Essential (primary) hypertension: I10

## 2022-02-04 LAB — CBC WITH DIFFERENTIAL/PLATELET
Abs Immature Granulocytes: 0.03 10*3/uL (ref 0.00–0.07)
Basophils Absolute: 0 10*3/uL (ref 0.0–0.1)
Basophils Relative: 0 %
Eosinophils Absolute: 0 10*3/uL (ref 0.0–0.5)
Eosinophils Relative: 0 %
HCT: 47.6 % (ref 39.0–52.0)
Hemoglobin: 16 g/dL (ref 13.0–17.0)
Immature Granulocytes: 0 %
Lymphocytes Relative: 8 %
Lymphs Abs: 0.7 10*3/uL (ref 0.7–4.0)
MCH: 31.9 pg (ref 26.0–34.0)
MCHC: 33.6 g/dL (ref 30.0–36.0)
MCV: 95 fL (ref 80.0–100.0)
Monocytes Absolute: 0.5 10*3/uL (ref 0.1–1.0)
Monocytes Relative: 6 %
Neutro Abs: 6.9 10*3/uL (ref 1.7–7.7)
Neutrophils Relative %: 86 %
Platelets: 196 10*3/uL (ref 150–400)
RBC: 5.01 MIL/uL (ref 4.22–5.81)
RDW: 12.3 % (ref 11.5–15.5)
WBC: 8 10*3/uL (ref 4.0–10.5)
nRBC: 0 % (ref 0.0–0.2)

## 2022-02-04 LAB — RESP PANEL BY RT-PCR (FLU A&B, COVID) ARPGX2
Influenza A by PCR: NEGATIVE
Influenza B by PCR: NEGATIVE
SARS Coronavirus 2 by RT PCR: NEGATIVE

## 2022-02-04 LAB — COMPREHENSIVE METABOLIC PANEL
ALT: 30 U/L (ref 0–44)
AST: 26 U/L (ref 15–41)
Albumin: 4.3 g/dL (ref 3.5–5.0)
Alkaline Phosphatase: 53 U/L (ref 38–126)
Anion gap: 11 (ref 5–15)
BUN: 29 mg/dL — ABNORMAL HIGH (ref 6–20)
CO2: 24 mmol/L (ref 22–32)
Calcium: 8.9 mg/dL (ref 8.9–10.3)
Chloride: 98 mmol/L (ref 98–111)
Creatinine, Ser: 1.16 mg/dL (ref 0.61–1.24)
GFR, Estimated: >60 mL/min (ref >60)
Glucose, Bld: 109 mg/dL — ABNORMAL HIGH (ref 70–99)
Potassium: 3.5 mmol/L (ref 3.5–5.1)
Sodium: 133 mmol/L — ABNORMAL LOW (ref 135–145)
Total Bilirubin: 0.8 mg/dL (ref 0.3–1.2)
Total Protein: 7.9 g/dL (ref 6.5–8.1)

## 2022-02-04 LAB — LIPASE, BLOOD: Lipase: 31 U/L (ref 11–51)

## 2022-02-04 NOTE — ED Triage Notes (Signed)
Per pt's wife, pt has been feeling dizzy, lightheaded, and has been sweating profusely and experiencing nausea/vomiting/diarrhea. Per pt, he had blood drawn at [facility] long tonight.

## 2022-02-04 NOTE — ED Triage Notes (Signed)
Pt complains of flu like symptoms x 24 hrs.

## 2022-02-04 NOTE — ED Provider Triage Note (Signed)
Emergency Medicine Provider Triage Evaluation Note  Ronald Anderson , a 36 y.o. male  was evaluated in triage.  Pt complains of dizziness, nausea, vomiting, diarrhea, chills, and diaphoresis that started at 7AM this morning. Denies chest pain and shortness of breath. No sick contacts or known COVID exposures.   Review of Systems  Positive: N/V/D Negative: CP  Physical Exam  BP 115/72 (BP Location: Right Arm)    Pulse 85    Temp 98.5 F (36.9 C) (Oral)    Resp 20    Ht 6' (1.829 m)    Wt 90.7 kg    SpO2 96%    BMI 27.12 kg/m  Gen:   Awake, no distress   Resp:  Normal effort  MSK:   Moves extremities without difficulty  Other:    Medical Decision Making  Medically screening exam initiated at 10:07 PM.  Appropriate orders placed.  Ronald Anderson was informed that the remainder of the evaluation will be completed by another provider, this initial triage assessment does not replace that evaluation, and the importance of remaining in the ED until their evaluation is complete.  Labs COVID/flu test Suspect viral etiology   Ronald Anderson 02/04/22 2209

## 2022-02-05 ENCOUNTER — Encounter (HOSPITAL_COMMUNITY): Payer: Self-pay | Admitting: Emergency Medicine

## 2022-02-05 MED ORDER — LOPERAMIDE HCL 2 MG PO CAPS
4.0000 mg | ORAL_CAPSULE | Freq: Once | ORAL | Status: AC
  Administered 2022-02-05: 4 mg via ORAL
  Filled 2022-02-05: qty 2

## 2022-02-05 MED ORDER — ONDANSETRON HCL 4 MG PO TABS: 4.0000 mg | ORAL_TABLET | Freq: Four times a day (QID) | ORAL | 0 refills | Status: DC | PRN

## 2022-02-05 MED ORDER — LACTATED RINGERS IV BOLUS
1000.0000 mL | Freq: Once | INTRAVENOUS | Status: AC
  Administered 2022-02-05: 1000 mL via INTRAVENOUS

## 2022-02-05 MED ORDER — ONDANSETRON HCL 4 MG/2ML IJ SOLN
4.0000 mg | Freq: Once | INTRAMUSCULAR | Status: AC
  Administered 2022-02-05: 4 mg via INTRAVENOUS
  Filled 2022-02-05: qty 2

## 2022-02-05 NOTE — ED Provider Notes (Signed)
[facility] EMERGENCY DEPARTMENT Provider Note   CSN: [id number] Arrival date & time: 02/04/22  2258     History  Chief Complaint  Patient presents with   Emesis    Ronald Anderson is a 36 y.o. male.  The history is provided by the patient.  Emesis He has history of hypertension, asthma, GERD and comes in because of weakness, dizziness, nausea, diarrhea.  He woke up this morning feeling weak and dizzy and had low-grade fever up to 100.5.  There were chills but no sweats.  He had nausea.  He tried going to work, but returned and slept through most of the day.  This evening, he has developed diarrhea and he has vomited once.  He did have an episode of fecal incontinence where he thought he was going to pass flatus and in stead expelled diarrhea.  He denies abdominal pain.  He denies any sick contacts.  He went to [facility] emergency department where labs were drawn, but he was upset about the wait and came here.   Home Medications Prior to Admission medications   Medication Sig Start Date End Date Taking? Authorizing Provider  ADDERALL XR 15 MG 24 hr capsule Take by mouth every morning. 11/29/21   [provider]  albuterol (VENTOLIN HFA) 108 (90 Base) MCG/ACT inhaler INHALE 2 PUFFS INTO THE LUNGS EVERY 6 (SIX) HOURS AS NEEDED FOR WHEEZING. 03/24/19   [name], MD  amLODipine (NORVASC) 10 MG tablet TAKE 1 TABLET(10 MG) BY MOUTH DAILY 12/08/21   Cantwell, Celeste C, PA-C  Budeson-Glycopyrrol-Formoterol (BREZTRI AEROSPHERE) 160-9-4.8 MCG/ACT AERO Inhale 2 puffs into the lungs in the morning and at bedtime. 10/17/21   Hunsucker, Lesia Sago, MD  Budeson-Glycopyrrol-Formoterol (BREZTRI AEROSPHERE) 160-9-4.8 MCG/ACT AERO Inhale 2 puffs into the lungs in the morning and at bedtime. 12/04/21   Hunsucker, Lesia Sago, MD  buPROPion (WELLBUTRIN XL) 150 MG 24 hr tablet Take 150 mg by mouth every morning. 10/04/21   [provider]  cetirizine (ZYRTEC) 5  MG tablet Take 5 mg by mouth daily.    [provider]  FLUoxetine (PROZAC) 40 MG capsule 40 mg. 09/23/21   [provider]  Fluticasone-Umeclidin-Vilant (TRELEGY ELLIPTA) 200-62.5-25 MCG/ACT AEPB Inhale 1 puff into the lungs daily. 10/19/21   Hunsucker, Lesia Sago, MD  gabapentin (NEURONTIN) 100 MG capsule Take 300 mg by mouth. 10/04/21   [provider]  hydrochlorothiazide (MICROZIDE) 12.5 MG capsule Take 12.5 mg by mouth daily. 05/09/21   [provider]  metoprolol succinate (TOPROL-XL) 25 MG 24 hr tablet Take 1 tablet (25 mg total) by mouth daily. Take with or immediately following a meal. 05/10/21 05/05/22  Cantwell, Celeste C, PA-C  montelukast (SINGULAIR) 10 MG tablet Take 1 tablet (10 mg total) by mouth at bedtime. TAKE 1 TABLET(10 MG) BY MOUTH AT BEDTIME Strength: 10 mg 12/01/21   [name], MD  Multiple Vitamin (MULTIVITAMIN) tablet Take 1 tablet by mouth daily.    [provider]  olmesartan (BENICAR) 20 MG tablet Take 1 tablet (20 mg total) by mouth daily. 04/26/21   Cantwell, Celeste C, PA-C  omeprazole (PRILOSEC OTC) 20 MG tablet Take 1 tablet (20 mg total) by mouth 2 (two) times daily. 11/03/13   [name], MD      Allergies    Flonase [fluticasone] and Penicillins    Review of Systems   Review of Systems  Gastrointestinal:  Positive for vomiting.  All other systems reviewed  and are negative.  Physical Exam Updated Vital Signs BP 116/79    Pulse 73    Temp 99.4 F (37.4 C) (Oral)    Resp 16    SpO2 95%  Physical Exam Vitals and nursing note reviewed.  36 year old male, resting comfortably and in no acute distress. Vital signs are normal. Oxygen saturation is 95%, which is normal. Head is normocephalic and atraumatic. PERRLA, EOMI. Oropharynx is clear. Neck is nontender and supple without adenopathy or JVD. Back is nontender and there is no CVA tenderness. Lungs are clear without rales, wheezes, or rhonchi. Chest is  nontender. Heart has regular rate and rhythm without murmur. Abdomen is soft, flat, nontender without masses or hepatosplenomegaly and peristalsis is hypoactive. Extremities have no cyanosis or edema, full range of motion is present. Skin is warm and dry without rash. Neurologic: Mental status is normal, cranial nerves are intact, moves all extremities equally.  ED Results / Procedures / Treatments   Labs (all labs ordered are listed, but only abnormal results are displayed) Labs Reviewed - No data to display  Procedures Procedures    Medications Ordered in ED Medications  lactated ringers bolus 1,000 mL (has no administration in time range)  ondansetron (ZOFRAN) injection 4 mg (has no administration in time range)  loperamide (IMODIUM) capsule 4 mg (has no administration in time range)    ED Course/ Medical Decision Making/ A&P                           Medical Decision Making Risk Prescription drug management.   Nausea, vomiting, diarrhea, fever, chills strongly suggestive of viral gastroenteritis, possible food poisoning.  No red flags to suggest more serious pathology such as cholecystitis, diverticulitis, appendicitis, bowel obstruction.  Labs which were drawn at [facility] reviewed showing mild hyponatremia which is not felt to be clinically significant, elevated BUN consistent with dehydration, normal WBC and hemoglobin.  He is given IV fluids, ondansetron for nausea and oral loperamide for diarrhea.  Old records are reviewed, and he has no relevant past visits.  He feels significantly better following above-noted treatment.  He is no longer feeling dizzy.  He was able to ambulate without dizziness.  He is discharged with prescription for ondansetron and told to take over-the-counter loperamide as needed for diarrhea.  Return precautions discussed.        Final Clinical Impression(s) / ED Diagnoses Final diagnoses:  Nausea vomiting and diarrhea     Rx / DC Orders ED Discharge Orders          Ordered    ondansetron (ZOFRAN) 4 MG tablet  Every 6 hours PRN        02/05/22 0321              Dione Booze, MD 02/05/22 [phone]

## 2022-02-05 NOTE — ED Notes (Signed)
Sodas given to pt and visitor

## 2022-02-05 NOTE — ED Notes (Signed)
E-signature pad unavailable at time of pt discharge. This RN discussed discharge materials with pt and answered all pt questions. Pt stated understanding of discharge material. ? ?

## 2022-02-05 NOTE — ED Notes (Signed)
Pt ambulated to the bathroom without assistance. 

## 2022-02-05 NOTE — Discharge Instructions (Signed)
Take loperamide (Imodium A-D) as needed for diarrhea.  Return if your symptoms are getting worse. 

## 2022-02-06 ENCOUNTER — Telehealth: Payer: Self-pay | Admitting: Family Medicine

## 2022-02-06 NOTE — Telephone Encounter (Signed)
Left message for pt to call concerning er visit. Pt needs a follow up with [facility].

## 2022-02-09 NOTE — Telephone Encounter (Signed)
Patient had ED visit and repeated CBC w diff. Abs eosinophils <150.  Will re-submit prior authorization to [facility] for Nucala. Key: [id number]  Chesley Mires, PharmD, MPH, BCPS Clinical Pharmacist (Rheumatology and Pulmonology)

## 2022-02-13 ENCOUNTER — Other Ambulatory Visit (HOSPITAL_COMMUNITY): Payer: Self-pay

## 2022-02-13 NOTE — Telephone Encounter (Signed)
Received notification from [facility] regarding a prior authorization for NUCALA. Authorization has been APPROVED from 02/09/22 to 02/09/23.   Unable to run test claim because patient must fill through [facility]: [phone]   Authorization # [id number]  Enrolled patient into copay card: Member ID: [id number] Confirmation number: [id number] Group Number: [id number] Rx Bin: [id number] PCN: [id number]  Ronald Anderson, PharmD, MPH, BCPS Clinical Pharmacist (Rheumatology and Pulmonology)

## 2022-02-20 ENCOUNTER — Other Ambulatory Visit (HOSPITAL_COMMUNITY): Payer: Self-pay

## 2022-02-20 ENCOUNTER — Ambulatory Visit (INDEPENDENT_AMBULATORY_CARE_PROVIDER_SITE_OTHER): Payer: 59 | Admitting: Pharmacist

## 2022-02-20 ENCOUNTER — Other Ambulatory Visit: Payer: Self-pay

## 2022-02-20 DIAGNOSIS — J4540 Moderate persistent asthma, uncomplicated: Secondary | ICD-10-CM

## 2022-02-20 DIAGNOSIS — Z7189 Other specified counseling: Secondary | ICD-10-CM

## 2022-02-20 MED ORDER — BREZTRI AEROSPHERE 160-9-4.8 MCG/ACT IN AERO: 2.0000 | INHALATION_SPRAY | Freq: Two times a day (BID) | RESPIRATORY_TRACT | 11 refills | Status: DC

## 2022-02-20 MED ORDER — NUCALA 100 MG/ML ~~LOC~~ SOAJ: 100.0000 mg | SUBCUTANEOUS | 5 refills | Status: DC

## 2022-02-20 NOTE — Progress Notes (Signed)
HPI Patient presents today to [facility] to see pharmacy team for [facility] new start.  Past medical history includes longstanding history of asthma.  He was stable on Advair but had noticed progressive worsening of symptoms.   Respiratory Medications Current regimen: Breztri 160/9/4.8 mcg/actuation (2 puffs into the lung twice daily), montelukast 10mg  nightly,  Tried in past: Advair for many years but it had waning response Patient reports adherence challenges with Breztri. States it was not approved through insurance  OBJECTIVE Allergies  Allergen Reactions   Flonase [Fluticasone] Other (See Comments)    migrianes   Penicillins Hives    Outpatient Encounter Medications as of 02/20/2022  Medication Sig   ADDERALL XR 15 MG 24 hr capsule Take by mouth every morning.   albuterol (VENTOLIN HFA) 108 (90 Base) MCG/ACT inhaler INHALE 2 PUFFS INTO THE LUNGS EVERY 6 (SIX) HOURS AS NEEDED FOR WHEEZING.   amLODipine (NORVASC) 10 MG tablet TAKE 1 TABLET(10 MG) BY MOUTH DAILY   Budeson-Glycopyrrol-Formoterol (BREZTRI AEROSPHERE) 160-9-4.8 MCG/ACT AERO Inhale 2 puffs into the lungs in the morning and at bedtime.   Budeson-Glycopyrrol-Formoterol (BREZTRI AEROSPHERE) 160-9-4.8 MCG/ACT AERO Inhale 2 puffs into the lungs in the morning and at bedtime.   buPROPion (WELLBUTRIN XL) 150 MG 24 hr tablet Take 150 mg by mouth every morning.   cetirizine (ZYRTEC) 5 MG tablet Take 5 mg by mouth daily.   FLUoxetine (PROZAC) 40 MG capsule 40 mg.   Fluticasone-Umeclidin-Vilant (TRELEGY ELLIPTA) 200-62.5-25 MCG/ACT AEPB Inhale 1 puff into the lungs daily.   gabapentin (NEURONTIN) 100 MG capsule Take 300 mg by mouth.   hydrochlorothiazide (MICROZIDE) 12.5 MG capsule Take 12.5 mg by mouth daily.   metoprolol succinate (TOPROL-XL) 25 MG 24 hr tablet Take 1 tablet (25 mg total) by mouth daily. Take with or immediately following a meal.   montelukast (SINGULAIR) 10 MG tablet Take 1 tablet (10 mg total) by mouth at  bedtime. TAKE 1 TABLET(10 MG) BY MOUTH AT BEDTIME Strength: 10 mg   Multiple Vitamin (MULTIVITAMIN) tablet Take 1 tablet by mouth daily.   olmesartan (BENICAR) 20 MG tablet Take 1 tablet (20 mg total) by mouth daily.   omeprazole (PRILOSEC OTC) 20 MG tablet Take 1 tablet (20 mg total) by mouth 2 (two) times daily.   ondansetron (ZOFRAN) 4 MG tablet Take 1 tablet (4 mg total) by mouth every 6 (six) hours as needed for nausea.   No facility-administered encounter medications on file as of 02/20/2022.     Immunization History  Administered Date(s) Administered   Influenza Split 09/28/2014   Influenza,inj,Quad PF,6+ Mos 10/13/2017   Influenza-Unspecified 09/22/2014, 10/31/2016, 11/23/2018, 12/25/2019   Moderna SARS-COV2 Booster Vaccination 04/24/2021   Moderna Sars-Covid-2 Vaccination 07/11/2020, 08/08/2020   Tdap 12/24/2010, 04/24/2021    Eosinophils Most recent blood eosinophil count was <50 cells/microL taken on 02/04/22, 200 on 10/12/21  IgE: 59  on 10/12/21  Assessment   Biologics training for mepolizumab (Nucala)  Goals of therapy: Mechanism of Action: Not fully understood. It does act an interleukin-5 (IL-5) antagonist monoclonal antibody that reduces the production and survival of eosinophils by blocking the binding of IL-5 to the alpha chain of the receptor complex on the eosinophil cell surface. Reviewed that Nucala is add-on medication and patient must continue maintenance inhaler regimen. Response to therapy: may take 3 months to 6 months to determine efficacy. Discussed that patients generally feel improvement sooner than 3 months.  Side effects: headache (19%), injection site reaction (7-15%), antibody development (6%), backache (5%),  fatigue (5%)  Dose: 100 mg subcutaneously every 4 weeks  Administration/Storage:  Reviewed administration sites of thigh or abdomen (at least 2-3 inches away from abdomen). Reviewed the upper arm is only appropriate if caregiver is  administering injection  Do not shake the reconstituted solution as this could lead to product foaming or precipitation. Solution should be clear to opalescent and colorless to pale yellow or pale brown, essentially particle free. Small air bubbles, however, are expected and acceptable. If particulate matter remains in the solution or if the solution appears cloudy or milky, discard the solution.  Reviewed storage of medication in refrigerator. Reviewed that Nucala can be stored at room temperature in unopened carton for up to 7 days.  Patient self-administered Nucala 100mg /ml autoinjector in left thigh. NDC: Lot: [id number] Exp: 04/2024  Patient monitored for 30 minutes. No issues reported by patient. No visible signs of distress or reaction/ No injection site reaction noted.  Access: Approval of Nucala through: insurance Patient enrolled into copay card program to help with copay assistance.  Medication Reconciliation  A drug regimen assessment was performed, including review of allergies, interactions, disease-state management, dosing and immunization history. Medications were reviewed with the patient, including name, instructions, indication, goals of therapy, potential side effects, importance of adherence, and safe use.  Drug interaction(s): none noted  Per test claim of Breztri, his copay is $100 per inhaler. I signed patient up for copay card today to help reduce copay further  Immunizations  Patient has received 3 COVID19 vaccines. He declined influenza vaccine recently per chart review  PLAN Continue Nucala 100mg  every 4 weeks. Next dose is due 03/20/22 and every 4 weeks thereafter. Rx sent to: [facility]: [phone] . Patient provided with pharmacy phone number and advised to call later this week to schedule shipment to home. Patient provided with copay card information to provide to pharmacy if quoted copay exceeds $5 per month. Continue maintenance  asthma regimen of: Breztri 160/9/4.8 mcg/actuation (2 puffs into the lung twice daily), montelukast 10mg  nightly  All questions encouraged and answered.  Instructed patient to reach out with any further questions or concerns.  Thank you for allowing pharmacy to participate in this patient's care.  This appointment required 60 minutes of patient care (this includes precharting, chart review, review of results, face-to-face care, etc.).  03/22/22, PharmD, MPH, BCPS Clinical Pharmacist (Rheumatology and Pulmonology)

## 2022-02-20 NOTE — Patient Instructions (Addendum)
Your next Nucala dose is due on 03/20/22, 04/17/22, and every 4 weeks thereafter  CONTINUE Breztri and montelukast. We enrolled you into the copay card today. You should receive information in your email/text once you have service. Please provide this information to your [facility] to help bump your copay card   Your Anselmo prescription will be shipped from [facility]. Their phone number is [phone] Please call to schedule shipment and confirm address. They will mail your medication to your home.  Your copay should be affordable. If you call the pharmacy and it is not affordable, please double-check that they are billing through your copay card as secondary coverage. That copay card information is: Member ID: [id number] Group Number: [id number] Rx Bin: [id number] PCN: [id number]  You will need to be seen by your provider in 3 to 4 months to assess how NUCALA is working for you. Please ensure you have a follow-up appointment scheduled in May or June. Call our clinic if you need to make this appointment.  How to manage an injection site reaction: Remember the 5 C's: COUNTER - leave on the counter at least 30 minutes but up to overnight to bring medication to room temperature. This may help prevent stinging COLD - place something cold (like an ice gel pack or cold water bottle) on the injection site just before cleansing with alcohol. This may help reduce pain CLARITIN - use Claritin (generic name is loratadine) for the first two weeks of treatment or the day of, the day before, and the day after injecting. This will help to minimize injection site reactions CORTISONE CREAM - apply if injection site is irritated and itching CALL ME - if injection site reaction is bigger than the size of your fist, looks infected, blisters, or if you develop hives

## 2022-02-28 ENCOUNTER — Other Ambulatory Visit: Payer: Self-pay | Admitting: Family Medicine

## 2022-02-28 DIAGNOSIS — J309 Allergic rhinitis, unspecified: Secondary | ICD-10-CM

## 2022-02-28 DIAGNOSIS — J4530 Mild persistent asthma, uncomplicated: Secondary | ICD-10-CM

## 2022-03-10 ENCOUNTER — Other Ambulatory Visit: Payer: Self-pay | Admitting: Student

## 2022-04-26 ENCOUNTER — Ambulatory Visit: Payer: 59 | Admitting: Student

## 2022-04-26 NOTE — Progress Notes (Incomplete)
? ?Primary Physician:  [name], MD ? ? ?Patient ID: BOONE GEAR, male    DOB: 10-12-1986, 36 y.o.   MRN: [id number] ? ?Subjective:  ? ? ?No chief complaint on file. ? ? ?HPI: NALU TROUBLEFIELD  is a 36 y.o. Caucasian male with asthma, acid reflux, hypertension, family history of premature CAD.  His mother has history of atrial fibrillation nonischemic cardiomyopathy.  Father with first MI in late 40s/early 52s.  Patient denies former tobacco use or illicit drug use. ? ?Patient presents for 1 year follow up. At last office visit added HCTZ which patient was unable to tolerate due to renal insufficiency, therefore he was switched to Toprol-XL. Also at last office visit ordered coronary calcium score which unfortunately has not been done. *** ? ?*** ? ?He was originally referred to our office for evaluation of chest pain in January 2021.  He was last seen 01/13/2020 by Donnelly Stager who had advised 4-week follow-up of hypertension and cardiac testing results.  Patient was unfortunately lost to follow-up till now. ? ?Patient now presents for follow-up with concerns of elevated blood pressure.  Patient reports he was without his blood pressure for approximately 1 week and noted Blood pressure as high as 153/110 mmHg, which prompted him to make follow-up appointment in our office.  Also previously been advised to undergo coronary calcium score, which has not been done.  Patient is presently asymptomatic, denies chest pain, palpitations, syncope, near syncope, dizziness, claudication.  ? ?Patient does note that his wife is currently 9 months pregnant and he has a 50-year-old at home, as well as he works approximately 3 hours/week.  Therefore patient has been experiencing increased stress as well as admits to poor diet compliance, eating out frequently.  He does remain very active at work, walking 7-8 miles 4 to 5 days/week without issue. ? ?Patient reports recent work physical and lab work, including lipid testing which  was reportedly normal, but no labs available for my personal review. He follows with PCP as well.  ? ?Past Medical History:  ?Diagnosis Date  ?? Allergic rhinitis   ?? Asthma   ?? Dysthymia   ?? GERD (gastroesophageal reflux disease)   ?? Hypertension   ? ?Past Surgical History:  ?Procedure Laterality Date  ?? WISDOM TOOTH EXTRACTION    ? ?Family History  ?Problem Relation Age of Onset  ?? Heart disease Mother   ?? Stroke Father   ?? Heart attack Father 46  ?? Hypertension Father   ? ?Social History  ? ?Tobacco Use  ?? Smoking status: Never  ?? Smokeless tobacco: Never  ?Substance Use Topics  ?? Alcohol use: Yes  ?  Comment: 6 beers per month  ?Marital Status: Married ? ? ?ROS:  ? ?Review of Systems  ?Constitutional: Negative for decreased appetite, malaise/fatigue, weight gain and weight loss.  ?Eyes:  Negative for visual disturbance.  ?Cardiovascular:  Negative for chest pain, claudication, dyspnea on exertion, leg swelling, near-syncope, orthopnea, palpitations, paroxysmal nocturnal dyspnea and syncope.  ?Respiratory:  Negative for hemoptysis, shortness of breath and wheezing.   ?Endocrine: Negative for cold intolerance and heat intolerance.  ?Hematologic/Lymphatic: Does not bruise/bleed easily.  ?Skin:  Negative for nail changes.  ?Musculoskeletal:  Negative for muscle weakness and myalgias.  ?Gastrointestinal:  Negative for abdominal pain, change in bowel habit, melena, nausea and vomiting.  ?Neurological:  Negative for difficulty with concentration, dizziness, focal weakness, headaches and weakness.  ?Psychiatric/Behavioral:  Negative for altered mental status and suicidal ideas.   ?  All other systems reviewed and are negative. ?   ?Objective:  ?There were no vitals taken for this visit. There is no height or weight on file to calculate BMI. ?   ?Physical Exam ?Vitals reviewed.  ?Constitutional:   ?   Appearance: He is well-developed.  ?HENT:  ?   Head: Normocephalic and atraumatic.  ?Cardiovascular:  ?   Rate  and Rhythm: Normal rate and regular rhythm. Occasional Extrasystoles are present. ?   Pulses: Intact distal pulses.  ?   Heart sounds: Normal heart sounds.  ?Pulmonary:  ?   Effort: Pulmonary effort is normal. No accessory muscle usage or respiratory distress.  ?   Breath sounds: Normal breath sounds.  ?Abdominal:  ?   General: Bowel sounds are normal.  ?   Palpations: Abdomen is soft.  ?Musculoskeletal:     ?   General: Normal range of motion.  ?   Cervical back: Normal range of motion.  ?Skin: ?   General: Skin is warm and dry.  ?Neurological:  ?   Mental Status: He is alert and oriented to person, place, and time.  ? ?Laboratory examination:  ? ? ?  Latest Ref Rng & Units 02/04/2022  ? 10:16 PM 05/05/2021  ? 11:41 AM 01/11/2020  ?  1:21 AM  ?CMP  ?Glucose 70 - 99 mg/dL 696109   75   295114    ?BUN 6 - 20 mg/dL 29   21   13     ?Creatinine 0.61 - 1.24 mg/dL 2.841.16   1.321.41   4.401.00    ?Sodium 135 - 145 mmol/L 133   141   137    ?Potassium 3.5 - 5.1 mmol/L 3.5   4.4   3.9    ?Chloride 98 - 111 mmol/L 98   101   103    ?CO2 22 - 32 mmol/L 24   27   23     ?Calcium 8.9 - 10.3 mg/dL 8.9   9.6   9.0    ?Total Protein 6.5 - 8.1 g/dL 7.9      ?Total Bilirubin 0.3 - 1.2 mg/dL 0.8      ?Alkaline Phos 38 - 126 U/L 53      ?AST 15 - 41 U/L 26      ?ALT 0 - 44 U/L 30      ? ? ?  Latest Ref Rng & Units 02/04/2022  ? 10:16 PM 10/12/2021  ?  4:36 PM 01/11/2020  ?  1:21 AM  ?CBC  ?WBC 4.0 - 10.5 K/uL 8.0   7.1   11.2    ?Hemoglobin 13.0 - 17.0 g/dL 10.216.0   72.515.0   36.616.9    ?Hematocrit 39.0 - 52.0 % 47.6   43.6   48.4    ?Platelets 150 - 400 K/uL 196   253.0   288    ? ?Lipid Panel  ?No results found for: CHOL, TRIG, HDL, CHOLHDL, VLDL, LDLCALC, LDLDIRECT ?HEMOGLOBIN A1C ?No results found for: HGBA1C, MPG ?TSH ?No results for input(s): TSH in the last 8760 hours. ? ?External labs:  ?None  ? ?Medications and Allergies:  ? ?Allergies  ?Allergen Reactions  ?? Flonase [Fluticasone] Other (See Comments)  ?  migrianes  ?? Penicillins Hives  ? ?Current  Outpatient Medications  ?Medication Instructions  ?? ADDERALL XR 15 MG 24 hr capsule Oral, Every morning  ?? albuterol (VENTOLIN HFA) 108 (90 Base) MCG/ACT inhaler INHALE 2 PUFFS INTO THE LUNGS EVERY 6 (SIX) HOURS AS  NEEDED FOR WHEEZING.  ?? amLODipine (NORVASC) 10 MG tablet TAKE 1 TABLET(10 MG) BY MOUTH DAILY  ?? Budeson-Glycopyrrol-Formoterol (BREZTRI AEROSPHERE) 160-9-4.8 MCG/ACT AERO 2 puffs, Inhalation, 2 times daily  ?? buPROPion (WELLBUTRIN XL) 150 mg, Oral, Every morning  ?? cetirizine (ZYRTEC) 5 mg, Oral, Daily  ?? FLUoxetine (PROZAC) 40 mg  ?? gabapentin (NEURONTIN) 300 mg, Oral  ?? hydrochlorothiazide (MICROZIDE) 12.5 mg, Oral, Daily  ?? metoprolol succinate (TOPROL-XL) 25 mg, Oral, Daily, Take with or immediately following a meal.  ?? montelukast (SINGULAIR) 10 MG tablet TAKE 1 TABLET BY MOUTH EVERY NIGHT AT BEDTIME.  ?? Multiple Vitamin (MULTIVITAMIN) tablet 1 tablet, Oral, Daily  ?? Nucala 100 mg, Subcutaneous, Every 28 days  ?? olmesartan (BENICAR) 20 mg, Oral, Daily  ?? omeprazole (PRILOSEC OTC) 20 mg, Oral, 2 times daily  ?? ondansetron (ZOFRAN) 4 mg, Oral, Every 6 hours PRN  ? ? ?Radiology:   ?No results found. ? ?Cardiac Studies:  ? ?Echocardiogram 01/21/2020:  ?Normal LV systolic function with EF 60%. Left ventricle cavity is normal in size. Normal global wall motion. Normal diastolic filling pattern. Calculated EF 60%. ? ?EKG  ? ?EKG 04/26/2021: Sinus rhythm at a rate of 74 bpm.  Normal axis.  No evidence of ischemia or underlying injury pattern.  Compared to EKG 01/13/2020, no significant change. ? ?Assessment:  ? ?No diagnosis found. ?No orders of the defined types were placed in this encounter. ? ?There are no discontinued medications. ? ? ?Recommendations:  ? ?DARTANYON FRANKOWSKI  is a 36 y.o.  Caucasian male with asthma, acid reflux, hypertension, family history of premature CAD.  His mother has history of atrial fibrillation nonischemic cardiomyopathy.  Patient denies former tobacco use or illicit  drug use. ? ?Patient presents for 1 year follow up. At last office visit added HCTZ which patient was unable to tolerate due to renal insufficiency, therefore he was switched to Toprol-XL. Also at last offi

## 2022-05-07 ENCOUNTER — Ambulatory Visit: Payer: 59 | Admitting: Pulmonary Disease

## 2022-05-11 ENCOUNTER — Other Ambulatory Visit: Payer: Self-pay | Admitting: Student

## 2022-05-26 ENCOUNTER — Other Ambulatory Visit: Payer: Self-pay | Admitting: Family Medicine

## 2022-05-26 DIAGNOSIS — J4530 Mild persistent asthma, uncomplicated: Secondary | ICD-10-CM

## 2022-05-26 DIAGNOSIS — J309 Allergic rhinitis, unspecified: Secondary | ICD-10-CM

## 2022-05-30 ENCOUNTER — Other Ambulatory Visit: Payer: Self-pay | Admitting: Student

## 2022-06-09 ENCOUNTER — Other Ambulatory Visit: Payer: Self-pay

## 2022-06-09 ENCOUNTER — Encounter (HOSPITAL_BASED_OUTPATIENT_CLINIC_OR_DEPARTMENT_OTHER): Payer: Self-pay

## 2022-06-09 ENCOUNTER — Emergency Department (HOSPITAL_BASED_OUTPATIENT_CLINIC_OR_DEPARTMENT_OTHER): Payer: 59 | Admitting: Radiology

## 2022-06-09 DIAGNOSIS — S91311A Laceration without foreign body, right foot, initial encounter: Secondary | ICD-10-CM | POA: Insufficient documentation

## 2022-06-09 DIAGNOSIS — Y92002 Bathroom of unspecified non-institutional (private) residence single-family (private) house as the place of occurrence of the external cause: Secondary | ICD-10-CM | POA: Diagnosis not present

## 2022-06-09 DIAGNOSIS — Y9301 Activity, walking, marching and hiking: Secondary | ICD-10-CM | POA: Insufficient documentation

## 2022-06-09 DIAGNOSIS — W25XXXA Contact with sharp glass, initial encounter: Secondary | ICD-10-CM | POA: Diagnosis not present

## 2022-06-09 DIAGNOSIS — Z5321 Procedure and treatment not carried out due to patient leaving prior to being seen by health care provider: Secondary | ICD-10-CM | POA: Insufficient documentation

## 2022-06-09 NOTE — ED Triage Notes (Signed)
Pt reports walking in his bathroom and stepped on a piece of glass from a glass his cat knocked over. Pt doesn't believe there is still glass in his foot but unsure, concerned he may need stitches.

## 2022-06-10 ENCOUNTER — Emergency Department (HOSPITAL_BASED_OUTPATIENT_CLINIC_OR_DEPARTMENT_OTHER)
Admission: EM | Admit: 2022-06-10 | Discharge: 2022-06-10 | Discharge status: Against Medical Advice | Payer: 59 | Attending: Emergency Medicine | Admitting: Emergency Medicine

## 2022-06-11 ENCOUNTER — Telehealth: Payer: Self-pay | Admitting: Family Medicine

## 2022-06-11 NOTE — Telephone Encounter (Signed)
Tried to call pt concerning recent er visit. Pt did not answer and no voice mail was available.

## 2022-06-16 ENCOUNTER — Other Ambulatory Visit: Payer: Self-pay | Admitting: Student

## 2022-08-09 NOTE — Telephone Encounter (Signed)
Seems like encounter was open in error so closing encounter.  

## 2022-08-29 ENCOUNTER — Encounter: Payer: Self-pay | Admitting: Internal Medicine

## 2022-09-02 ENCOUNTER — Telehealth: Payer: Self-pay | Admitting: Pulmonary Disease

## 2022-09-02 DIAGNOSIS — J4540 Moderate persistent asthma, uncomplicated: Secondary | ICD-10-CM

## 2022-09-03 ENCOUNTER — Ambulatory Visit (INDEPENDENT_AMBULATORY_CARE_PROVIDER_SITE_OTHER): Payer: 59 | Admitting: Family Medicine

## 2022-09-03 ENCOUNTER — Encounter: Payer: Self-pay | Admitting: Family Medicine

## 2022-09-03 VITALS — BP 128/88 | HR 83 | Temp 98.2°F | Wt 227.0 lb

## 2022-09-03 DIAGNOSIS — I10 Essential (primary) hypertension: Secondary | ICD-10-CM

## 2022-09-03 DIAGNOSIS — Z23 Encounter for immunization: Secondary | ICD-10-CM | POA: Diagnosis not present

## 2022-09-03 DIAGNOSIS — R5383 Other fatigue: Secondary | ICD-10-CM | POA: Diagnosis not present

## 2022-09-03 DIAGNOSIS — R635 Abnormal weight gain: Secondary | ICD-10-CM | POA: Diagnosis not present

## 2022-09-03 DIAGNOSIS — G4733 Obstructive sleep apnea (adult) (pediatric): Secondary | ICD-10-CM

## 2022-09-03 DIAGNOSIS — Z1159 Encounter for screening for other viral diseases: Secondary | ICD-10-CM

## 2022-09-03 NOTE — Telephone Encounter (Signed)
Further refills for Nucala cannot be sent until pt is seen for f/u appt with Dr. Judeth Horn. Only one refill for Nucala has been sent today so that he can be seen.   Note, pt was no-show on 05/07/22  Chesley Mires, PharmD, MPH, BCPS, CPP Clinical Pharmacist (Rheumatology and Pulmonology)

## 2022-09-03 NOTE — Telephone Encounter (Signed)
LMTCB

## 2022-09-03 NOTE — Progress Notes (Signed)
   Subjective:    Patient ID: Ronald Anderson, male    DOB: 09-03-1986, 36 y.o.   MRN: [id number]  HPI He is here for evaluation of a several month history of fatigue, weight gain, excessive sweating and what he describes as hot flashes.  He has had no skin or hair changes.  Apparently his libido is okay.  No fever, chills, cough or congestion.  He does have history of OSA and is on a CPAP but does not use it regularly.  He also has a history of hypertension however this was taken care of by another physician but he states that he is taking Norvasc.   Review of Systems     Objective:   Physical Exam Alert and in no distress. Tympanic membranes and canals are normal. Pharyngeal area is normal. Neck is supple without adenopathy or thyromegaly. Cardiac exam shows a regular sinus rhythm without murmurs or gallops. Lungs are clear to auscultation.  DTRs are 2+.  Skin is normal.        Assessment & Plan:  Fatigue, unspecified type - Plan: Testosterone, CBC with Differential/Platelet, Comprehensive metabolic panel, Lipid panel, TSH  OSA (obstructive sleep apnea)  Primary hypertension - Plan: CBC with Differential/Platelet, Comprehensive metabolic panel  Weight gain - Plan: CBC with Differential/Platelet, Comprehensive metabolic panel, Lipid panel, TSH  Need for influenza vaccination - Plan: Flu Vaccine QUAD 17mo+IM (Fluarix, Fluzone & Alfiuria Quad PF)  Need for hepatitis C screening test - Plan: Hepatitis C antibody I explained that there are multiple variables that can be causing his symptoms.  He definitely needs to use the CPAP regularly to eliminate that is the cause of some of his issues.  I will do routine blood screening and follow-up based on that.  Flu shots awful given today.

## 2022-09-04 ENCOUNTER — Encounter: Payer: Self-pay | Admitting: Family Medicine

## 2022-09-04 LAB — CBC WITH DIFFERENTIAL/PLATELET
Basophils Absolute: 0 10*3/uL (ref 0.0–0.2)
Basos: 0 %
EOS (ABSOLUTE): 0 10*3/uL (ref 0.0–0.4)
Eos: 0 %
Hematocrit: 43.7 % (ref 37.5–51.0)
Hemoglobin: 14.8 g/dL (ref 13.0–17.7)
Immature Grans (Abs): 0 10*3/uL (ref 0.0–0.1)
Immature Granulocytes: 0 %
Lymphocytes Absolute: 1.1 10*3/uL (ref 0.7–3.1)
Lymphs: 22 %
MCH: 32.1 pg (ref 26.6–33.0)
MCHC: 33.9 g/dL (ref 31.5–35.7)
MCV: 95 fL (ref 79–97)
Monocytes Absolute: 0.5 10*3/uL (ref 0.1–0.9)
Monocytes: 9 %
Neutrophils Absolute: 3.5 10*3/uL (ref 1.4–7.0)
Neutrophils: 69 %
Platelets: 226 10*3/uL (ref 150–450)
RBC: 4.61 x10E6/uL (ref 4.14–5.80)
RDW: 11.9 % (ref 11.6–15.4)
WBC: 5.1 10*3/uL (ref 3.4–10.8)

## 2022-09-04 LAB — COMPREHENSIVE METABOLIC PANEL
ALT: 39 IU/L (ref 0–44)
AST: 26 IU/L (ref 0–40)
Albumin/Globulin Ratio: 1.8 (ref 1.2–2.2)
Albumin: 4.4 g/dL (ref 4.1–5.1)
Alkaline Phosphatase: 79 IU/L (ref 44–121)
BUN/Creatinine Ratio: 12 (ref 9–20)
BUN: 12 mg/dL (ref 6–20)
Bilirubin Total: 0.4 mg/dL (ref 0.0–1.2)
CO2: 22 mmol/L (ref 20–29)
Calcium: 9.1 mg/dL (ref 8.7–10.2)
Chloride: 100 mmol/L (ref 96–106)
Creatinine, Ser: 1.04 mg/dL (ref 0.76–1.27)
Globulin, Total: 2.5 g/dL (ref 1.5–4.5)
Glucose: 203 mg/dL — ABNORMAL HIGH (ref 70–99)
Potassium: 3.7 mmol/L (ref 3.5–5.2)
Sodium: 138 mmol/L (ref 134–144)
Total Protein: 6.9 g/dL (ref 6.0–8.5)
eGFR: 95 mL/min/{1.73_m2} (ref >59)

## 2022-09-04 LAB — LIPID PANEL
Chol/HDL Ratio: 5.4 ratio — ABNORMAL HIGH (ref 0.0–5.0)
Cholesterol, Total: 190 mg/dL (ref 100–199)
HDL: 35 mg/dL — ABNORMAL LOW (ref >39)
LDL Chol Calc (NIH): 117 mg/dL — ABNORMAL HIGH (ref 0–99)
Triglycerides: 213 mg/dL — ABNORMAL HIGH (ref 0–149)
VLDL Cholesterol Cal: 38 mg/dL (ref 5–40)

## 2022-09-04 LAB — TSH: TSH: 1.89 u[IU]/mL (ref 0.450–4.500)

## 2022-09-04 LAB — HEPATITIS C ANTIBODY: Hep C Virus Ab: NONREACTIVE

## 2022-09-04 LAB — TESTOSTERONE: Testosterone: 146 ng/dL — ABNORMAL LOW (ref 264–916)

## 2022-09-04 NOTE — Addendum Note (Signed)
Addended by: [name] on: 09/04/2022 08:16 AM   Modules accepted: Orders

## 2022-09-05 LAB — SPECIMEN STATUS REPORT

## 2022-09-05 LAB — HGB A1C W/O EAG: Hgb A1c MFr Bld: 5.6 % (ref 4.8–5.6)

## 2022-09-06 ENCOUNTER — Other Ambulatory Visit: Payer: 59

## 2022-09-06 DIAGNOSIS — R5383 Other fatigue: Secondary | ICD-10-CM

## 2022-09-07 LAB — TESTOSTERONE: Testosterone: 118 ng/dL — ABNORMAL LOW (ref 264–916)

## 2022-09-10 LAB — FOLLICLE STIMULATING HORMONE: FSH: 2.3 m[IU]/mL (ref 1.5–12.4)

## 2022-09-10 LAB — LUTEINIZING HORMONE: LH: 6.2 m[IU]/mL (ref 1.7–8.6)

## 2022-09-10 LAB — SPECIMEN STATUS REPORT

## 2022-09-10 NOTE — Telephone Encounter (Signed)
LMTCB; sent mychart message as well for pt to call and schedule f/u

## 2022-09-11 ENCOUNTER — Telehealth: Payer: 59 | Admitting: Family Medicine

## 2022-09-11 VITALS — Ht 72.0 in | Wt 227.0 lb

## 2022-09-11 DIAGNOSIS — E291 Testicular hypofunction: Secondary | ICD-10-CM | POA: Diagnosis not present

## 2022-09-11 MED ORDER — TESTOSTERONE CYPIONATE 200 MG/ML IJ SOLN: 200.0000 mg | INTRAMUSCULAR | 1 refills | Status: DC

## 2022-09-11 NOTE — Progress Notes (Signed)
   Subjective:    Patient ID: Ronald Anderson, male    DOB: 1986/06/02, 36 y.o.   MRN: [id number]  HPI Documentation for virtual audio and video telecommunications through [place] encounter: The patient was located at home. 2 patient identifiers used.  The provider was located in the office. The patient did consent to this visit and is aware of possible charges through their insurance for this visit. The other persons participating in this telemedicine service were none. Time spent on call was 5 minutes and in review of previous records >20 minutes total for counseling and coordination of care. This virtual service is not related to other E/M service within previous 7 days.  This visit is to discuss testosterone.  He does have data indicating he is hypogonadal.    Review of Systems     Objective:   Physical Exam Alert and in no distress otherwise not examined       Assessment & Plan:  Hypogonadism in male - Plan: Testosterone Cypionate 200 MG/ML SOLN I discussed the treatment of hypogonadism with him in regard to energy, strength, stamina, libido.  Discussed different replacements including injections, topical, oral.  After discussion with him he would like to try the injectable.  He apparently his wife's experience with getting shots.  I will place the appropriate order and when the medication comes in he is to come in here to discuss shots and to set up for follow-up visit to assess value.

## 2022-09-13 ENCOUNTER — Telehealth: Payer: Self-pay | Admitting: Family Medicine

## 2022-09-13 NOTE — Telephone Encounter (Signed)
error 

## 2022-09-13 NOTE — Telephone Encounter (Signed)
Pt called and wants to know if he can come in tomorrow morning for his shot, I wasn't sure if you wanted him to come Monday specifically from previous messages or is tomorrow morning ok?

## 2022-09-14 NOTE — Telephone Encounter (Signed)
Not sure what he would like to do. He sent a lot of messages. I asked him to call and schedule a nurse visit according to his schedule and he stated that Monday would work for him. Is he coming today?

## 2022-09-15 ENCOUNTER — Other Ambulatory Visit: Payer: Self-pay | Admitting: Family Medicine

## 2022-09-15 DIAGNOSIS — J4530 Mild persistent asthma, uncomplicated: Secondary | ICD-10-CM

## 2022-09-15 DIAGNOSIS — J309 Allergic rhinitis, unspecified: Secondary | ICD-10-CM

## 2022-09-17 ENCOUNTER — Other Ambulatory Visit: Payer: Self-pay

## 2022-09-17 ENCOUNTER — Other Ambulatory Visit (INDEPENDENT_AMBULATORY_CARE_PROVIDER_SITE_OTHER): Payer: 59

## 2022-09-17 DIAGNOSIS — E291 Testicular hypofunction: Secondary | ICD-10-CM | POA: Diagnosis not present

## 2022-09-17 DIAGNOSIS — R7989 Other specified abnormal findings of blood chemistry: Secondary | ICD-10-CM | POA: Insufficient documentation

## 2022-09-17 MED ORDER — NEEDLE (DISP) 21G X 1" MISC
1.0000 | 1 refills | Status: DC
Start: 2022-09-17 — End: 2024-01-13

## 2022-09-17 MED ORDER — "NEEDLE (DISP) 18G X 1"" MISC": 1.0000 | 1 refills | Status: DC

## 2022-09-17 MED ORDER — TESTOSTERONE CYPIONATE 200 MG/ML IM SOLN
200.0000 mg | INTRAMUSCULAR | Status: DC
  Administered 2022-09-17: 200 mg via INTRAMUSCULAR

## 2022-09-18 ENCOUNTER — Other Ambulatory Visit: Payer: Self-pay

## 2022-09-18 MED ORDER — "SYRINGE/NEEDLE (DISP) 21G X 1"" 3 ML MISC": 1.0000 | 1 refills | Status: DC

## 2022-10-02 ENCOUNTER — Other Ambulatory Visit: Payer: Self-pay | Admitting: Pulmonary Disease

## 2022-10-02 ENCOUNTER — Encounter: Payer: Self-pay | Admitting: Internal Medicine

## 2022-10-02 DIAGNOSIS — J4540 Moderate persistent asthma, uncomplicated: Secondary | ICD-10-CM

## 2022-10-02 NOTE — Telephone Encounter (Signed)
Needs f/u asap, overdue

## 2022-10-04 NOTE — Telephone Encounter (Signed)
Patient scheduled 10/31 at 9:15am with Dr. Silas Flood.

## 2022-10-05 ENCOUNTER — Telehealth: Payer: Self-pay | Admitting: Family Medicine

## 2022-10-05 NOTE — Telephone Encounter (Signed)
When orders are put in for blood work. Pt wants to come in Tuesday or Wednesday at 8:30 am and said we can send him a my chart message to let him know.

## 2022-10-05 NOTE — Telephone Encounter (Signed)
Pt called and is done with his second round of testosterone and is ready to come in for his blood work, I don't see any orders to schedule him.

## 2022-10-09 ENCOUNTER — Ambulatory Visit (INDEPENDENT_AMBULATORY_CARE_PROVIDER_SITE_OTHER): Payer: 59 | Admitting: Family Medicine

## 2022-10-09 ENCOUNTER — Encounter: Payer: Self-pay | Admitting: Family Medicine

## 2022-10-09 VITALS — BP 130/70 | HR 86 | Ht 72.0 in | Wt 222.8 lb

## 2022-10-09 DIAGNOSIS — E291 Testicular hypofunction: Secondary | ICD-10-CM

## 2022-10-09 NOTE — Progress Notes (Signed)
   Subjective:    Patient ID: Ronald Anderson, male    DOB: 1986-07-23, 36 y.o.   MRN: [id number]  HPI He is here for recheck on his hypogonadism.  He is on a 2-week cycle.  He can tell at the end of the 2 weeks that his energy and stamina is decreased.  He has noted a definite improvement in his energy stamina strength and libido except at the very end of the 2-week cycle.  He does not smoke.   Review of Systems     Objective:   Physical Exam        Assessment & Plan:  Hypogonadism in male - Plan: Testosterone Is number if his number looks good I will leave him at this dosing.  I would like to see it definitely above 400.

## 2022-10-10 LAB — TESTOSTERONE: Testosterone: 520 ng/dL (ref 264–916)

## 2022-10-15 ENCOUNTER — Encounter: Payer: Self-pay | Admitting: Internal Medicine

## 2022-10-23 ENCOUNTER — Encounter: Payer: Self-pay | Admitting: Pulmonary Disease

## 2022-10-23 ENCOUNTER — Ambulatory Visit: Payer: 59 | Admitting: Pulmonary Disease

## 2022-10-23 ENCOUNTER — Other Ambulatory Visit: Payer: Self-pay | Admitting: Pulmonary Disease

## 2022-10-23 ENCOUNTER — Ambulatory Visit (INDEPENDENT_AMBULATORY_CARE_PROVIDER_SITE_OTHER): Payer: 59 | Admitting: Pulmonary Disease

## 2022-10-23 DIAGNOSIS — J4540 Moderate persistent asthma, uncomplicated: Secondary | ICD-10-CM

## 2022-10-23 MED ORDER — BREZTRI AEROSPHERE 160-9-4.8 MCG/ACT IN AERO: 2.0000 | INHALATION_SPRAY | Freq: Two times a day (BID) | RESPIRATORY_TRACT | 11 refills | Status: DC

## 2022-10-23 MED ORDER — MEPOLIZUMAB 100 MG/ML ~~LOC~~ SOAJ: 1.0000 | SUBCUTANEOUS | 13 refills | Status: DC

## 2022-10-23 NOTE — Telephone Encounter (Signed)
Will send refills after patient is seen at [facility] today  Knox Saliva, PharmD, MPH, BCPS, CPP Clinical Pharmacist (Rheumatology and Pulmonology)

## 2022-10-23 NOTE — Patient Instructions (Signed)
Nice to see you again  I refilled the Nucala as well as the Breztri inhaler  No changes in medications  Return to clinic in 6 months or sooner as needed with Dr. Silas Flood

## 2022-10-23 NOTE — Progress Notes (Signed)
@Patient  ID: , male    DOB: 27-Sep-1986, 36 y.o.   MRN: [id number]  Chief Complaint  Patient presents with   Follow-up    Pt is here for follow up to get his nucala refilled. Possible blood work. Pt is being seen for asthma. Last shot was 10/15/2022. Pt states he is doing well with his shots with no issues noted.     Referring provider: 10/17/2022, MD  HPI:   36 y.o. man whom we are seeing in follow up  for evaluation of dyspnea on exertion and asthma.    At last visit, was on triple inhaled therapy and using albuterol twice daily.  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  Reports this is helped a lot.  Symptoms better.  Still has some occasional rattling and wheeze.  Usually at the tail end of the 4 weeks.  But overall satisfied with symptom control.  Albuterol use has decreased overall.  HPI at initial visit:  Longstanding history of asthma.  Diagnosed many years ago.  Had a relatively stable on high-dose Advair discus.  However last several years has had progressive worsening of symptoms.  Does not like it there is working.  Discretion is dyspnea, cough with exertion.  Leads to wheezing.  Can use albuterol with gradual improvement but not 200%.  Overall worsening the last several years.  Recently tried air duo high-dose without improvement.  No inciting event or trigger to the symptoms.  No other relieving or exacerbating factors.  No time of day when things are better or worse.  No positions that make things better or worse. No seasonal or environmental factors that he can find to make things better or worse.  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  Dogs in the house.  Antihistamines greatly improved the symptoms.  Reviewed echocardiogram 12/2020 that is grossly normal.  Review chest x-ray 01/11/2020 on my review and interpretation reveals mild hyperinflation without infiltrate or abnormality otherwise.   Reviewed prior most recent chest x-ray 2011 on my review and interpretation reveals similar hyperinflation, more soft tissue recently compared to 2011.  reviewed recent lab work 10/22 with eosinophils 200, IgE 59.  PMH: Asthma, hypertension Surgical history: Wisdom tooth extraction Family history: CAD in mother, father with CAD, stroke, hypertension Social history: Never smoker, lives in [place]  Questionaires / Pulmonary Flowsheets:   ACT:  Asthma Control Test ACT Total Score  10/23/2022  2:05 PM 22  12/04/2021  9:37 AM 17  10/17/2021  9:59 AM 9    MMRC:     No data to display          Epworth:      No data to display          Tests:   FENO:  No results found for: "NITRICOXIDE"  PFT:     No data to display          WALK:      No data to display          Imaging: Personally reviewed and as per EMR discussion of this note  Lab Results: Personally reviewed CBC    Component Value Date/Time   WBC 5.1 09/03/2022 1032   WBC 8.0 02/04/2022 2216   RBC 4.61 09/03/2022 1032   RBC 5.01 02/04/2022 2216   HGB 14.8 09/03/2022 1032   HCT 43.7 09/03/2022 1032   PLT 226 09/03/2022 1032   MCV  95 09/03/2022 1032   MCH 32.1 09/03/2022 1032   MCH 31.9 02/04/2022 2216   MCHC 33.9 09/03/2022 1032   MCHC 33.6 02/04/2022 2216   RDW 11.9 09/03/2022 1032   LYMPHSABS 1.1 09/03/2022 1032   MONOABS 0.5 02/04/2022 2216   EOSABS 0.0 09/03/2022 1032   BASOSABS 0.0 09/03/2022 1032    BMET    Component Value Date/Time   NA 138 09/03/2022 1032   K 3.7 09/03/2022 1032   CL 100 09/03/2022 1032   CO2 22 09/03/2022 1032   GLUCOSE 203 (H) 09/03/2022 1032   GLUCOSE 109 (H) 02/04/2022 2216   BUN 12 09/03/2022 1032   CREATININE 1.04 09/03/2022 1032   CALCIUM 9.1 09/03/2022 1032   GFRNONAA >60 02/04/2022 2216   GFRAA >60 01/11/2020 0121    BNP No results found for: "BNP"  ProBNP No results found for: "PROBNP"  Specialty Problems       Pulmonary  Problems   Asthma   Allergic rhinitis   OSA (obstructive sleep apnea)    Allergies  Allergen Reactions   Flonase [Fluticasone] Other (See Comments)    migrianes   Penicillins Hives    Immunization History  Administered Date(s) Administered   Influenza Split 09/28/2014   Influenza,inj,Quad PF,6+ Mos 10/13/2017, 09/03/2022   Influenza-Unspecified 09/22/2014, 10/31/2016, 11/23/2018, 12/25/2019   Moderna SARS-COV2 Booster Vaccination 04/24/2021   Moderna Sars-Covid-2 Vaccination 07/11/2020, 08/08/2020   Tdap 12/24/2010, 04/24/2021    Past Medical History:  Diagnosis Date   Allergic rhinitis    Asthma    Dysthymia    GERD (gastroesophageal reflux disease)    Hypertension     Tobacco History: Social History   Tobacco Use  Smoking Status Never  Smokeless Tobacco Never   Counseling given: Not Answered   Continue to not smoke  Outpatient Encounter Medications as of 10/23/2022  Medication Sig   ADDERALL XR 25 MG 24 hr capsule Take by mouth daily.   albuterol (VENTOLIN HFA) 108 (90 Base) MCG/ACT inhaler INHALE 2 PUFFS INTO THE LUNGS EVERY 6 (SIX) HOURS AS NEEDED FOR WHEEZING.   amLODipine (NORVASC) 10 MG tablet TAKE 1 TABLET(10 MG) BY MOUTH DAILY   cetirizine (ZYRTEC) 10 MG tablet Take 10 mg by mouth daily.   gabapentin (NEURONTIN) 100 MG capsule Take 300 mg by mouth.   Mepolizumab 100 MG/ML SOAJ Inject 1 mL (100 mg total) into the skin every 28 (twenty-eight) days.   montelukast (SINGULAIR) 10 MG tablet TAKE 1 TABLET BY MOUTH EVERY NIGHT AT BEDTIME   Multiple Vitamin (MULTIVITAMIN) tablet Take 1 tablet by mouth daily.   NEEDLE, DISP, 18 G 18G X 1" MISC 1 Needle by Does not apply route every 14 (fourteen) days.   NEEDLE, DISP, 21 G 21G X 1" MISC 1 Needle by Does not apply route every 14 (fourteen) days.   NUCALA 100 MG/ML SOAJ INJECT 100MG  SUBCUTANEOUSLY  EVERY 4 WEEKS   omeprazole (PRILOSEC OTC) 20 MG tablet Take 1 tablet (20 mg total) by mouth 2 (two) times daily.    SYRINGE-NEEDLE, DISP, 3 ML 21G X 1" 3 ML MISC 1 each by Does not apply route every 14 (fourteen) days.   testosterone cypionate (DEPOTESTOSTERONE CYPIONATE) 200 MG/ML injection Inject 200 mg into the muscle every 14 (fourteen) days.   venlafaxine XR (EFFEXOR-XR) 150 MG 24 hr capsule Take 150 mg by mouth daily.   venlafaxine XR (EFFEXOR-XR) 37.5 MG 24 hr capsule Take 37.5 mg by mouth daily with breakfast.   [DISCONTINUED] Budeson-Glycopyrrol-Formoterol (BREZTRI AEROSPHERE)  160-9-4.8 MCG/ACT AERO Inhale 2 puffs into the lungs in the morning and at bedtime.   Budeson-Glycopyrrol-Formoterol (BREZTRI AEROSPHERE) 160-9-4.8 MCG/ACT AERO Inhale 2 puffs into the lungs in the morning and at bedtime.   No facility-administered encounter medications on file as of 10/23/2022.     Review of Systems  Review of Systems  N/a Physical Exam  BP 120/72 (BP Location: Left Arm, Patient Position: Sitting, Cuff Size: Normal)   Pulse 91   Ht 6' (1.829 m)   Wt 223 lb 12.8 oz (101.5 kg)   SpO2 97%   BMI 30.35 kg/m   Wt Readings from Last 5 Encounters:  10/23/22 223 lb 12.8 oz (101.5 kg)  10/09/22 222 lb 12.8 oz (101.1 kg)  09/11/22 227 lb (103 kg)  09/03/22 227 lb (103 kg)  06/09/22 210 lb (95.3 kg)    BMI Readings from Last 5 Encounters:  10/23/22 30.35 kg/m  10/09/22 30.22 kg/m  09/11/22 30.79 kg/m  09/03/22 30.79 kg/m  06/09/22 28.48 kg/m     Physical Exam General: Well-appearing, no acute distress Eyes: EOMI, no icterus Neck: Supple, no JVP Pulmonary: Distant, clear, normal work of breathing, no wheeze Cardiovascular: Regular rate and rhythm, no murmur MSK: No synovitis, no joint effusion Neuro: Normal gait, no weakness Psych: Normal mood, full affect   Assessment & Plan:   Dyspnea, cough: Suspect related to poorly controlled asthma.  Chest imaging clear with exception of hyper inflation.  High-dose ICS/LABA without improvement.  Prior spirometry with fixed obstruction 2018.   Mild improvement with triple inhaled therapy via Breztri.  To continue.  Refilled today.  Asthma: Overall a bit better controlled but realistically poorly controlled given daily albuterol use.  On Breztri.  IgE around 50.  Eos 200.  Started Nucala in early 2023.  Improved symptoms.  Continue this, refilled today.  Breztri refilled today.   Return in about 6 months (around 04/23/2023).   Karren Burly, MD 10/23/2022

## 2022-10-24 NOTE — Telephone Encounter (Signed)
Dr. Silas Flood refilled patient's Nucala at [facility] yesterday, 10/24/22. Will close encounter  Knox Saliva, PharmD, MPH, BCPS, CPP Clinical Pharmacist (Rheumatology and Pulmonology)

## 2022-11-14 ENCOUNTER — Emergency Department (HOSPITAL_BASED_OUTPATIENT_CLINIC_OR_DEPARTMENT_OTHER): Payer: 59

## 2022-11-14 ENCOUNTER — Telehealth: Payer: Self-pay | Admitting: Family Medicine

## 2022-11-14 ENCOUNTER — Encounter (HOSPITAL_BASED_OUTPATIENT_CLINIC_OR_DEPARTMENT_OTHER): Payer: Self-pay

## 2022-11-14 ENCOUNTER — Other Ambulatory Visit: Payer: Self-pay

## 2022-11-14 ENCOUNTER — Emergency Department (HOSPITAL_BASED_OUTPATIENT_CLINIC_OR_DEPARTMENT_OTHER)
Admission: EM | Admit: 2022-11-14 | Discharge: 2022-11-14 | Disposition: A | Discharge status: Home / Self Care | Payer: 59 | Attending: Emergency Medicine | Admitting: Emergency Medicine

## 2022-11-14 DIAGNOSIS — R600 Localized edema: Secondary | ICD-10-CM | POA: Diagnosis not present

## 2022-11-14 DIAGNOSIS — I160 Hypertensive urgency: Secondary | ICD-10-CM | POA: Diagnosis not present

## 2022-11-14 DIAGNOSIS — R202 Paresthesia of skin: Secondary | ICD-10-CM | POA: Diagnosis present

## 2022-11-14 DIAGNOSIS — Z79899 Other long term (current) drug therapy: Secondary | ICD-10-CM | POA: Diagnosis not present

## 2022-11-14 DIAGNOSIS — I10 Essential (primary) hypertension: Secondary | ICD-10-CM | POA: Diagnosis not present

## 2022-11-14 DIAGNOSIS — R000 Tachycardia, unspecified: Secondary | ICD-10-CM | POA: Insufficient documentation

## 2022-11-14 LAB — CBC WITH DIFFERENTIAL/PLATELET
Abs Immature Granulocytes: 0.04 10*3/uL (ref 0.00–0.07)
Basophils Absolute: 0 10*3/uL (ref 0.0–0.1)
Basophils Relative: 0 %
Eosinophils Absolute: 0 10*3/uL (ref 0.0–0.5)
Eosinophils Relative: 0 %
HCT: 46 % (ref 39.0–52.0)
Hemoglobin: 15.7 g/dL (ref 13.0–17.0)
Immature Granulocytes: 1 %
Lymphocytes Relative: 28 %
Lymphs Abs: 2.2 10*3/uL (ref 0.7–4.0)
MCH: 31.8 pg (ref 26.0–34.0)
MCHC: 34.1 g/dL (ref 30.0–36.0)
MCV: 93.3 fL (ref 80.0–100.0)
Monocytes Absolute: 0.7 10*3/uL (ref 0.1–1.0)
Monocytes Relative: 8 %
Neutro Abs: 5 10*3/uL (ref 1.7–7.7)
Neutrophils Relative %: 63 %
Platelets: 270 10*3/uL (ref 150–400)
RBC: 4.93 MIL/uL (ref 4.22–5.81)
RDW: 12.3 % (ref 11.5–15.5)
WBC: 7.9 10*3/uL (ref 4.0–10.5)
nRBC: 0 % (ref 0.0–0.2)

## 2022-11-14 LAB — PROTIME-INR
INR: 1 (ref 0.8–1.2)
Prothrombin Time: 13.1 seconds (ref 11.4–15.2)

## 2022-11-14 LAB — COMPREHENSIVE METABOLIC PANEL
ALT: 35 U/L (ref 0–44)
AST: 25 U/L (ref 15–41)
Albumin: 4.4 g/dL (ref 3.5–5.0)
Alkaline Phosphatase: 65 U/L (ref 38–126)
Anion gap: 9 (ref 5–15)
BUN: 16 mg/dL (ref 6–20)
CO2: 28 mmol/L (ref 22–32)
Calcium: 9.4 mg/dL (ref 8.9–10.3)
Chloride: 101 mmol/L (ref 98–111)
Creatinine, Ser: 1.06 mg/dL (ref 0.61–1.24)
GFR, Estimated: >60 mL/min (ref >60)
Glucose, Bld: 90 mg/dL (ref 70–99)
Potassium: 3.7 mmol/L (ref 3.5–5.1)
Sodium: 138 mmol/L (ref 135–145)
Total Bilirubin: 0.4 mg/dL (ref 0.3–1.2)
Total Protein: 7.6 g/dL (ref 6.5–8.1)

## 2022-11-14 LAB — MAGNESIUM: Magnesium: 2 mg/dL (ref 1.7–2.4)

## 2022-11-14 LAB — APTT: aPTT: 34 seconds (ref 24–36)

## 2022-11-14 NOTE — Telephone Encounter (Signed)
Pt called and states that he woke up this am and his left hand was swollen and tingling. It has been tingling all day. He states he just took his bp and it was 190/117, Please advise pt at [phone]. Tried to [employer] and no one at stations. Putting message in and will walk back to nurse station.

## 2022-11-14 NOTE — Discharge Instructions (Addendum)
Record your blood pressures, ideally once a day, for the next week or so and bring these to your doctor.  Call him for potential medication adjustments.  For your tingling sensation I am referring you to neurology.   If you develop headache, weakness or numbness in your arms or legs, speech difficulty, visual complaints, or any other new/concerning symptoms then return to the ER for evaluation.

## 2022-11-14 NOTE — Telephone Encounter (Signed)
Left message on patient's voicemail advising him that he needs to be evaluated and to please be seen at [facility].

## 2022-11-14 NOTE — ED Triage Notes (Signed)
Pt c/o HTN "through the roof," L hand/arm tingling/ pins & needles, BLE swelling & broken capillaries surrounding ankles. Compliant w home meds, denies new known stressors

## 2022-11-14 NOTE — ED Provider Notes (Signed)
[facility] EMERGENCY DEPT Provider Note   CSN: [id number] Arrival date & time: 11/14/22  1706     History  Chief Complaint  Patient presents with   Hypertension   Tingling    Ronald Anderson is a 36 y.o. male.  HPI 36 year old male with a history of hypertension presents with pins and needle sensation.  Went to bed last night with no sensations like this.  When he woke up he had left hand symptoms that have progressively work their way up to his elbow.  This sensation gets worse when he tries to grip tightly with his left hand.  Over the last few hours he has noticed some pins-and-needles in his left foot, in a sock distribution as well as in his right foot though less.  The left arm sensation is in a glove distribution.  No headache, neck pain, chest pain.  His right arm feels normal.  He also endorses leg swelling this been on and off for weeks, thinks this might be from the amlodipine.  He had "broken capillaries" on and off during this time as well.  Noticed it again last night.  There is no pain.    While at work he checked his blood pressure and it was 190/117 with a heart rate of 111.  Called his doctor and was concerned and came to the emergency department.   Home Medications Prior to Admission medications   Medication Sig Start Date End Date Taking? Authorizing Provider  ADDERALL XR 25 MG 24 hr capsule Take by mouth daily. 10/08/22   [provider]  albuterol (VENTOLIN HFA) 108 (90 Base) MCG/ACT inhaler INHALE 2 PUFFS INTO THE LUNGS EVERY 6 (SIX) HOURS AS NEEDED FOR WHEEZING. 03/24/19   Denita Lung, MD  amLODipine (NORVASC) 10 MG tablet TAKE 1 TABLET(10 MG) BY MOUTH DAILY 06/18/22   Cantwell, Celeste C, PA-C  Budeson-Glycopyrrol-Formoterol (BREZTRI AEROSPHERE) 160-9-4.8 MCG/ACT AERO Inhale 2 puffs into the lungs in the morning and at bedtime. 10/23/22   Hunsucker, Bonna Gains, MD  cetirizine (ZYRTEC) 10 MG tablet Take 10 mg by mouth daily.     [provider]  gabapentin (NEURONTIN) 100 MG capsule Take 300 mg by mouth. 10/04/21   [provider]  Mepolizumab 100 MG/ML SOAJ Inject 1 mL (100 mg total) into the skin every 28 (twenty-eight) days. 10/23/22   Hunsucker, Bonna Gains, MD  montelukast (SINGULAIR) 10 MG tablet TAKE 1 TABLET BY MOUTH EVERY NIGHT AT BEDTIME 09/17/22   Denita Lung, MD  Multiple Vitamin (MULTIVITAMIN) tablet Take 1 tablet by mouth daily.    [provider]  NEEDLE, DISP, 18 G 18G X 1" MISC 1 Needle by Does not apply route every 14 (fourteen) days. 09/17/22   Denita Lung, MD  NEEDLE, DISP, 21 G 21G X 1" MISC 1 Needle by Does not apply route every 14 (fourteen) days. 09/17/22   Denita Lung, MD  NUCALA 100 MG/ML SOAJ INJECT 100MG  SUBCUTANEOUSLY  EVERY 4 WEEKS 10/02/22   Hunsucker, Bonna Gains, MD  omeprazole (PRILOSEC OTC) 20 MG tablet Take 1 tablet (20 mg total) by mouth 2 (two) times daily. 11/03/13   Denita Lung, MD  SYRINGE-NEEDLE, DISP, 3 ML 21G X 1" 3 ML MISC 1 each by Does not apply route every 14 (fourteen) days. 09/18/22   Denita Lung, MD  testosterone cypionate (DEPOTESTOSTERONE CYPIONATE) 200 MG/ML injection Inject 200 mg into the muscle every 14 (fourteen) days. 09/11/22   [provider]  venlafaxine XR (EFFEXOR-XR) 150 MG 24 hr capsule Take 150 mg by mouth daily. 10/05/22   [provider]  venlafaxine XR (EFFEXOR-XR) 37.5 MG 24 hr capsule Take 37.5 mg by mouth daily with breakfast. 08/28/22   [provider]      Allergies    Flonase [fluticasone] and Penicillins    Review of Systems   Review of Systems  Eyes:  Negative for visual disturbance.  Cardiovascular:  Positive for leg swelling. Negative for chest pain.  Musculoskeletal:  Negative for neck pain.  Neurological:  Negative for weakness, numbness and headaches.    Physical Exam Updated Vital Signs BP (!) 154/109 (BP Location: Right Arm)   Pulse 97   Temp 98 F (36.7 C) (Oral)    Resp 18   SpO2 98%  Physical Exam Vitals and nursing note reviewed.  Constitutional:      General: He is not in acute distress.    Appearance: He is well-developed. He is not ill-appearing or diaphoretic.  HENT:     Head: Normocephalic and atraumatic.  Eyes:     Extraocular Movements: Extraocular movements intact.     Pupils: Pupils are equal, round, and reactive to light.  Cardiovascular:     Rate and Rhythm: Normal rate and regular rhythm.     Pulses:          Dorsalis pedis pulses are 2+ on the right side and 2+ on the left side.     Heart sounds: Normal heart sounds.  Pulmonary:     Effort: Pulmonary effort is normal.     Breath sounds: Normal breath sounds.  Abdominal:     Palpations: Abdomen is soft.     Tenderness: There is no abdominal tenderness.  Musculoskeletal:     Comments: Slight edema in lower legs near ankles bilaterally. There is some dark red skin color change above the ankles. These do not blanch. Are not tender  Skin:    General: Skin is warm and dry.  Neurological:     Mental Status: He is alert.     Comments: CN 3-12 grossly intact. 5/5 strength in all 4 extremities. Grossly normal sensation. Normal finger to nose.      ED Results / Procedures / Treatments   Labs (all labs ordered are listed, but only abnormal results are displayed) Labs Reviewed  CBC WITH DIFFERENTIAL/PLATELET  COMPREHENSIVE METABOLIC PANEL  APTT  PROTIME-INR  MAGNESIUM    EKG EKG Interpretation  Date/Time:  Wednesday November 14 2022 17:19:55 EST Ventricular Rate:  101 PR Interval:  150 QRS Duration: 84 QT Interval:  332 QTC Calculation: 430 R Axis:   72 Text Interpretation: Sinus tachycardia Biatrial enlargement  diffuse nonspecific T waves Confirmed by Sherwood Gambler [phone]) on 11/14/2022 6:05:02 PM  Radiology CT Head Wo Contrast  Result Date: 11/14/2022 CLINICAL DATA:  Neuro deficit, acute, stroke suspected Left hand/arm tingling EXAM: CT HEAD WITHOUT CONTRAST  TECHNIQUE: Contiguous axial images were obtained from the base of the skull through the vertex without intravenous contrast. RADIATION DOSE REDUCTION: This exam was performed according to the departmental dose-optimization program which includes automated exposure control, adjustment of the mA and/or kV according to patient size and/or use of iterative reconstruction technique. COMPARISON:  None Available. FINDINGS: Brain: No intracranial hemorrhage, mass effect, or midline shift. No hydrocephalus. Low lying cerebellar tonsils but no crowding of the foramen magnum. The basilar cisterns are patent. No evidence of territorial infarct or acute ischemia. No extra-axial or intracranial fluid  collection. Vascular: No hyperdense vessel or unexpected calcification. Skull: No fracture or focal lesion. Sinuses/Orbits: Paranasal sinuses and mastoid air cells are clear. The visualized orbits are unremarkable. Other: None. IMPRESSION: No acute intracranial abnormality. Electronically Signed   By: Narda Rutherford M.D.   On: 11/14/2022 19:22    Procedures Procedures    Medications Ordered in ED Medications - No data to display  ED Course/ Medical Decision Making/ A&P                           Medical Decision Making Amount and/or Complexity of Data Reviewed Labs: ordered.    Details: Normal labs including electrolytes Radiology: ordered and independent interpretation performed.    Details: No head bleed ECG/medicine tests: independent interpretation performed.    Details: Sinus tachycardia already with nonspecific T waves is probably rate related.   Patient has diffuse paresthesias which involve all of his extremities except his right upper extremity.  There is no actual numbness or weakness.  I suspect this is peripheral.  Unclear what is causing it but at the same time I doubt its stroke.  A CT head was obtained but is negative.  Given lower suspicion for stroke I do not think MRI is needed.  However I  will refer him to a neurologist.  Otherwise from a hypertension perspective his hypertension is slowly improving without treatment.  We discussed options and I think for now it would be best to hold off on emergent blood pressure control and have him follow-up with PCP for potential adjustments.  Will discharge home with return precautions.        Final Clinical Impression(s) / ED Diagnoses Final diagnoses:  Paresthesias  Hypertensive urgency    Rx / DC Orders ED Discharge Orders     None         Pricilla Loveless, MD 11/14/22 2018

## 2022-11-19 ENCOUNTER — Ambulatory Visit (INDEPENDENT_AMBULATORY_CARE_PROVIDER_SITE_OTHER): Payer: 59 | Admitting: Family Medicine

## 2022-11-19 ENCOUNTER — Telehealth: Payer: Self-pay | Admitting: Family Medicine

## 2022-11-19 VITALS — BP 162/90 | HR 99 | Temp 98.4°F | Wt 226.4 lb

## 2022-11-19 DIAGNOSIS — I10 Essential (primary) hypertension: Secondary | ICD-10-CM

## 2022-11-19 DIAGNOSIS — R202 Paresthesia of skin: Secondary | ICD-10-CM

## 2022-11-19 NOTE — Telephone Encounter (Signed)
Transition Care Management Follow-up Telephone Call Date of discharge and from where: 01-21-1986 [facility]  How have you been since you were released from the hospital? BP still high Any questions or concerns? Yes ER still High  Items Reviewed: Did the pt receive and understand the discharge instructions provided? Yes  Medications obtained and verified? Yes  Other? No  Any new allergies since your discharge? No  Dietary orders reviewed? Yes Do you have support at home? Yes   Home Care and Equipment/Supplies: Were home health services ordered? not applicable    PCP Hospital f/u appt confirmed? Yes  Scheduled to see Dr. Susann Givens on 11/19/2022 @ 11:30. Are transportation arrangements needed? No  If their condition worsens, is the pt aware to call PCP or go to the Emergency Dept.? Yes Was the patient provided with contact information for the PCP's office or ED? Yes Was to pt encouraged to call back with questions or concerns? Yes

## 2022-11-19 NOTE — Progress Notes (Signed)
   Subjective:    Patient ID: Ronald Anderson, male    DOB: [birth year], 36 y.o.   MRN: [id number]  HPI He is here for follow-up visit.  He was seen in the emergency room on November 22 for evaluation of paresthesias.  He states that they are still present but mainly on the left hand area.  He continues on amlodipine for his blood pressure.  He does have an appointment with neurology.   Review of Systems     Objective:   Physical Exam ER record including lab and x-rays was evaluated.  The CT scan did not show anything significant. Alert and oriented x 3.  Normal motor and questionable decreased sensation in the median nerve distribution however Phalen's and Tinel's test was negative. Blood pressure today is elevated.     Assessment & Plan:  Paresthesias  Primary hypertension He has an appointment with neurology and I recommended that he keeps it.  I explained that I did not think this was CTS.  Concerning his blood pressure, I will monitor this but I do not feel the need to make a change in his blood pressure medication and we will check that again in 1 month.

## 2022-11-20 ENCOUNTER — Encounter: Payer: Self-pay | Admitting: Neurology

## 2022-12-14 ENCOUNTER — Other Ambulatory Visit: Payer: Self-pay | Admitting: Family Medicine

## 2022-12-14 DIAGNOSIS — J309 Allergic rhinitis, unspecified: Secondary | ICD-10-CM

## 2022-12-14 DIAGNOSIS — J4530 Mild persistent asthma, uncomplicated: Secondary | ICD-10-CM

## 2023-01-15 ENCOUNTER — Ambulatory Visit: Payer: 59 | Admitting: Neurology

## 2023-01-15 ENCOUNTER — Encounter: Payer: Self-pay | Admitting: Neurology

## 2023-01-15 DIAGNOSIS — Z029 Encounter for administrative examinations, unspecified: Secondary | ICD-10-CM

## 2023-02-21 ENCOUNTER — Telehealth: Payer: Self-pay | Admitting: Pulmonary Disease

## 2023-02-21 DIAGNOSIS — J4540 Moderate persistent asthma, uncomplicated: Secondary | ICD-10-CM

## 2023-02-21 NOTE — Telephone Encounter (Signed)
Submitted a Prior Authorization RENEWAL request to [facility] for [place] via CoverMyMeds. Will update once we receive a response.  Key: [id number]   Knox Saliva, PharmD, MPH, BCPS, CPP Clinical Pharmacist (Rheumatology and Pulmonology)

## 2023-02-21 NOTE — Telephone Encounter (Signed)
Nunzio Cory with [employer] calling regarding AP for [place] on behalf of Cover My Meds.   Cover Minutes Key Code is: [id number] is her number

## 2023-02-22 MED ORDER — NUCALA 100 MG/ML ~~LOC~~ SOAJ: SUBCUTANEOUS | 2 refills | Status: DC

## 2023-02-22 NOTE — Telephone Encounter (Signed)
Received notification from [facility] regarding a prior authorization for [place]. Authorization has been APPROVED from 02/21/2023 to 02/21/2024. Approval letter sent to scan center.  Patient must continue to fill through [place]: [phone]   Authorization # : [id number]  Knox Saliva, PharmD, MPH, BCPS, CPP Clinical Pharmacist (Rheumatology and Pulmonology)

## 2023-03-18 ENCOUNTER — Other Ambulatory Visit: Payer: Self-pay | Admitting: Family Medicine

## 2023-03-18 DIAGNOSIS — J309 Allergic rhinitis, unspecified: Secondary | ICD-10-CM

## 2023-03-18 DIAGNOSIS — J4530 Mild persistent asthma, uncomplicated: Secondary | ICD-10-CM

## 2023-03-19 MED ORDER — TESTOSTERONE CYPIONATE 200 MG/ML IM SOLN: 200.0000 mg | INTRAMUSCULAR | 0 refills | Status: DC

## 2023-03-19 MED ORDER — MONTELUKAST SODIUM 10 MG PO TABS: 10.0000 mg | ORAL_TABLET | Freq: Every day | ORAL | 0 refills | Status: DC

## 2023-04-16 ENCOUNTER — Other Ambulatory Visit: Payer: Self-pay | Admitting: Family Medicine

## 2023-04-16 DIAGNOSIS — J45909 Unspecified asthma, uncomplicated: Secondary | ICD-10-CM

## 2023-04-16 MED ORDER — ALBUTEROL SULFATE HFA 108 (90 BASE) MCG/ACT IN AERS: INHALATION_SPRAY | RESPIRATORY_TRACT | 0 refills | Status: DC

## 2023-04-16 NOTE — Telephone Encounter (Signed)
Refill request last apt 11/19/22.

## 2023-05-15 ENCOUNTER — Other Ambulatory Visit: Payer: Self-pay | Admitting: Family Medicine

## 2023-05-16 MED ORDER — TESTOSTERONE CYPIONATE 200 MG/ML IM SOLN: 200.0000 mg | INTRAMUSCULAR | 0 refills | Status: DC

## 2023-05-16 NOTE — Telephone Encounter (Signed)
Refill request last apt 11/19/22. 

## 2023-06-15 ENCOUNTER — Emergency Department (HOSPITAL_COMMUNITY): Payer: 59

## 2023-06-15 ENCOUNTER — Other Ambulatory Visit: Payer: Self-pay

## 2023-06-15 ENCOUNTER — Emergency Department (HOSPITAL_COMMUNITY)
Admission: EM | Admit: 2023-06-15 | Discharge: 2023-06-15 | Disposition: A | Discharge status: Home / Self Care | Payer: 59 | Attending: Emergency Medicine | Admitting: Emergency Medicine

## 2023-06-15 DIAGNOSIS — I159 Secondary hypertension, unspecified: Secondary | ICD-10-CM | POA: Diagnosis not present

## 2023-06-15 DIAGNOSIS — R42 Dizziness and giddiness: Secondary | ICD-10-CM | POA: Insufficient documentation

## 2023-06-15 DIAGNOSIS — I10 Essential (primary) hypertension: Secondary | ICD-10-CM | POA: Diagnosis present

## 2023-06-15 LAB — COMPREHENSIVE METABOLIC PANEL
ALT: 42 U/L (ref 0–44)
AST: 26 U/L (ref 15–41)
Albumin: 4 g/dL (ref 3.5–5.0)
Alkaline Phosphatase: 49 U/L (ref 38–126)
Anion gap: 10 (ref 5–15)
BUN: 19 mg/dL (ref 6–20)
CO2: 24 mmol/L (ref 22–32)
Calcium: 9 mg/dL (ref 8.9–10.3)
Chloride: 102 mmol/L (ref 98–111)
Creatinine, Ser: 1.18 mg/dL (ref 0.61–1.24)
GFR, Estimated: >60 mL/min (ref >60)
Glucose, Bld: 126 mg/dL — ABNORMAL HIGH (ref 70–99)
Potassium: 4 mmol/L (ref 3.5–5.1)
Sodium: 136 mmol/L (ref 135–145)
Total Bilirubin: 0.7 mg/dL (ref 0.3–1.2)
Total Protein: 7.1 g/dL (ref 6.5–8.1)

## 2023-06-15 LAB — LIPASE, BLOOD: Lipase: 27 U/L (ref 11–51)

## 2023-06-15 LAB — CBC WITH DIFFERENTIAL/PLATELET
Abs Immature Granulocytes: 0.08 10*3/uL — ABNORMAL HIGH (ref 0.00–0.07)
Basophils Absolute: 0 10*3/uL (ref 0.0–0.1)
Basophils Relative: 0 %
Eosinophils Absolute: 0 10*3/uL (ref 0.0–0.5)
Eosinophils Relative: 0 %
HCT: 45 % (ref 39.0–52.0)
Hemoglobin: 15.4 g/dL (ref 13.0–17.0)
Immature Granulocytes: 1 %
Lymphocytes Relative: 10 %
Lymphs Abs: 1.5 10*3/uL (ref 0.7–4.0)
MCH: 31.4 pg (ref 26.0–34.0)
MCHC: 34.2 g/dL (ref 30.0–36.0)
MCV: 91.6 fL (ref 80.0–100.0)
Monocytes Absolute: 0.8 10*3/uL (ref 0.1–1.0)
Monocytes Relative: 6 %
Neutro Abs: 12.3 10*3/uL — ABNORMAL HIGH (ref 1.7–7.7)
Neutrophils Relative %: 83 %
Platelets: 197 10*3/uL (ref 150–400)
RBC: 4.91 MIL/uL (ref 4.22–5.81)
RDW: 13.5 % (ref 11.5–15.5)
WBC: 14.8 10*3/uL — ABNORMAL HIGH (ref 4.0–10.5)
nRBC: 0 % (ref 0.0–0.2)

## 2023-06-15 LAB — TROPONIN I (HIGH SENSITIVITY): Troponin I (High Sensitivity): 3 ng/L (ref <18)

## 2023-06-15 MED ORDER — LACTATED RINGERS IV BOLUS
1000.0000 mL | Freq: Once | INTRAVENOUS | Status: AC
  Administered 2023-06-15: 1000 mL via INTRAVENOUS

## 2023-06-15 MED ORDER — HYDROCHLOROTHIAZIDE 25 MG PO TABS: 25.0000 mg | ORAL_TABLET | Freq: Every day | ORAL | 0 refills | Status: DC

## 2023-06-15 NOTE — Discharge Instructions (Signed)
You are seen today for high blood pressures.  Your objective workup is reassuring there is no evidence of hypertensive emergency. I recommend you restart the hydrochlorothiazide he used to be on.  It was represcribed today.  You also need to follow-up with your primary care provider for ongoing care and management.  Given your family history of heart disease, difficult to control hypertension in the outpatient setting, will refer you to heart care for further diagnostic care and management.

## 2023-06-15 NOTE — ED Triage Notes (Signed)
Patient reports history of HTN, was on medications, taken off a year ago BP has been giving him issues since. This morning he was standing up , saw a "flash" behind his eyes, checked his BP and it was 197/118 Patient diaphoretic in discharge, reports its not from high temperature outside as he works in air condition building.

## 2023-06-15 NOTE — ED Provider Notes (Signed)
[name] Provider Note   CSN: [id number] Arrival date & time: 06/15/23  1350     History Chief Complaint  Patient presents with   Hypertension    HPI Ronald Anderson is a 37 y.o. male presenting for chief complaint of lightheadedness and elevated blood pressure readings.  States that he has a history of hypertension was on 3 medications last year but lost to follow-up as he has had increasing psychosocial stressors. He denies fevers chills, nausea vomiting, syncope shortness of breath.  States that earlier today he had a sudden onset headache and visual disturbance that lasted approximately 1 minute.  He took his blood pressure and is in the 190s.  He sat down tried to relax and his blood pressure improved visual symptoms improved headache slightly improved and he comes to the emergency department for further diagnostic care and management.   Patient's recorded medical, surgical, social, medication list and allergies were reviewed in the Snapshot window as part of the initial history.   Review of Systems   Review of Systems  Constitutional:  Negative for chills and fever.  HENT:  Negative for ear pain and sore throat.   Eyes:  Negative for pain and visual disturbance.  Respiratory:  Negative for cough and shortness of breath.   Cardiovascular:  Negative for chest pain and palpitations.  Gastrointestinal:  Negative for abdominal pain and vomiting.  Genitourinary:  Negative for dysuria and hematuria.  Musculoskeletal:  Negative for arthralgias and back pain.  Skin:  Negative for color change and rash.  Neurological:  Negative for seizures and syncope.  All other systems reviewed and are negative.   Physical Exam Updated Vital Signs BP (!) 184/108   Pulse 80   Temp 98.3 F (36.8 C) (Oral)   Resp 20   Ht 6' (1.829 m)   Wt 104.3 kg   SpO2 95%   BMI 31.19 kg/m  Physical Exam Vitals and nursing note reviewed.  Constitutional:       General: He is not in acute distress.    Appearance: He is well-developed.  HENT:     Head: Normocephalic and atraumatic.  Eyes:     Conjunctiva/sclera: Conjunctivae normal.  Cardiovascular:     Rate and Rhythm: Normal rate and regular rhythm.     Heart sounds: No murmur heard. Pulmonary:     Effort: Pulmonary effort is normal. No respiratory distress.     Breath sounds: Normal breath sounds.  Abdominal:     Palpations: Abdomen is soft.     Tenderness: There is no abdominal tenderness.  Musculoskeletal:        General: No swelling.     Cervical back: Neck supple.  Skin:    General: Skin is warm and dry.     Capillary Refill: Capillary refill takes less than 2 seconds.  Neurological:     Mental Status: He is alert.  Psychiatric:        Mood and Affect: Mood normal.      ED Course/ Medical Decision Making/ A&P    Procedures Procedures   Medications Ordered in ED Medications  lactated ringers bolus 1,000 mL (0 mLs Intravenous Stopped 06/15/23 1659)   Medical Decision Making:   Ronald Anderson is a 37 y.o. male who presented to the ED today with elevated BP detailed above.    Patient's presentation is complicated by their history of multiple comorbid medical problems.  Patient placed on continuous vitals and telemetry  monitoring while in ED which was reviewed periodically.  Complete initial physical exam performed, notably the patient  was hemodynamically stable no acute distress.    Reviewed and confirmed nursing documentation for past medical history, family history, social history.    Initial Assessment:   With the patient's presentation of elevated blood pressure readings, most likely diagnosis is hypertensive urgency. Other diagnoses associated with hypertensive emergency were considered including (but not limited to) intracranial hemorrhage, acute renal artery stenosis, acute kidney injury, myocardial stress, ophthalmologic emergencies. These are considered less  likely due to history of present illness and physical exam findings.   This is most consistent with an acute life/limb threatening illness complicated by underlying chronic conditions. Will evaluate for hypertensive emergency as below. Initial Plan:  Screening labs including CBC and Metabolic panel to evaluate for infectious or metabolic etiology of disease.  CXR to evaluate for structural/infectious intrathoracic pathology.  Given headache, eval for ICH with CTH Troponin/EKG to evaluate for cardiac pathology. Objective evaluation as below reviewed. Considered further administration of antihypertensives in ED, per consensus guidelines for [facility] of emergency physicians, acute treatment of hypertensive urgency alone in the emergency department is not recommended.  If patient has evidence of hypertensive emergency on objective laboratory evaluation, will reevaluate.  Will monitor blood pressure while patient awaiting above laboratory studies.  Initial Study Results:   Laboratory  All laboratory results reviewed without evidence of clinically relevant pathology.    EKG EKG was reviewed independently. Rate, rhythm, axis, intervals all examined and without medically relevant abnormality. ST segments without concerns for elevations.    Radiology:  All images reviewed independently. Agree with radiology report at this time.   CT HEAD WO CONTRAST ( )  Result Date: 06/15/2023 CLINICAL DATA:  Headache, hypertension EXAM: CT HEAD WITHOUT CONTRAST TECHNIQUE: Contiguous axial images were obtained from the base of the skull through the vertex without intravenous contrast. RADIATION DOSE REDUCTION: This exam was performed according to the departmental dose-optimization program which includes automated exposure control, adjustment of the mA and/or kV according to patient size and/or use of iterative reconstruction technique. COMPARISON:  11/14/2022 FINDINGS: Brain: No acute infarct or hemorrhage.  Lateral ventricles and midline structures are unremarkable. No acute extra-axial fluid collections. No mass effect. Vascular: No hyperdense vessel or unexpected calcification. Skull: Normal. Negative for fracture or focal lesion. Sinuses/Orbits: No acute finding. Other: None. IMPRESSION: 1. Stable head CT, no acute intracranial process. Electronically Signed   By: Sharlet Salina M.D.   On: 06/15/2023 15:08   DG Chest Portable 1 View  Result Date: 06/15/2023 CLINICAL DATA:  Hypertension EXAM: PORTABLE CHEST 1 VIEW COMPARISON:  Chest radiograph dated 01/11/2020 FINDINGS: Normal lung volumes. No focal consolidations. No pleural effusion or pneumothorax. The heart size and mediastinal contours are within normal limits. No acute osseous abnormality. IMPRESSION: No active disease. Electronically Signed   By: Agustin Cree M.D.   On: 06/15/2023 15:05      Final Assessment and Plan:   Objective findings with no acute pathology.  Reassessed at bedside.  Headache is improving, blood pressures down trended while in the emergency room spontaneously.  She long conversation with patient, will restart him on his hydrochlorothiazide and recommend he follow-up with his primary care provider in the outpatient setting for ongoing care and management. Patient does not want to restart amlodipine due to history of severe leg swelling.  He would like to restart his hydrochlorothiazide.  Informed that he needs to have his potassium checked periodically ideally within  72 hours for long-term care management.  Disposition:  I have considered need for hospitalization, however, considering all of the above, I believe this patient is stable for discharge at this time.  Patient/family educated about specific return precautions for given chief complaint and symptoms.  Patient/family educated about follow-up with PCP and cardiology.     Patient/family expressed understanding of return precautions and need for follow-up. Patient spoken  to regarding all imaging and laboratory results and appropriate follow up for these results. All education provided in verbal form with additional information in written form. Time was allowed for answering of patient questions. Patient discharged.    Emergency Department Medication Summary:   Medications  lactated ringers bolus 1,000 mL (0 mLs Intravenous Stopped 06/15/23 1659)      Clinical Impression:  1. Secondary hypertension      Discharge   Final Clinical Impression(s) / ED Diagnoses Final diagnoses:  Secondary hypertension    Rx / DC Orders ED Discharge Orders          Ordered    Ambulatory referral to Cardiology        06/15/23 1657    hydrochlorothiazide (HYDRODIURIL) 25 MG tablet  Daily        06/15/23 1658              Glyn Ade, MD 06/15/23 1700

## 2023-07-01 ENCOUNTER — Telehealth: Payer: Self-pay | Admitting: Pulmonary Disease

## 2023-07-01 DIAGNOSIS — J4540 Moderate persistent asthma, uncomplicated: Secondary | ICD-10-CM

## 2023-07-02 NOTE — Telephone Encounter (Signed)
Refill sent for [facility] to [facility]: [phone]   Dose: 100 mg SQ every 4 weeks  Last OV: 10/23/2022 Provider: Dr . Judeth Horn  Next OV: not scheduled but overdue x 3 months. Routing to scheduling team  Chesley Mires, PharmD, MPH, BCPS Clinical Pharmacist (Rheumatology and Pulmonology)

## 2023-07-10 ENCOUNTER — Ambulatory Visit: Payer: 59 | Admitting: Pulmonary Disease

## 2023-07-10 ENCOUNTER — Encounter: Payer: Self-pay | Admitting: Pulmonary Disease

## 2023-07-10 VITALS — BP 132/86 | HR 87 | Ht 72.0 in | Wt 235.0 lb

## 2023-07-10 DIAGNOSIS — J4550 Severe persistent asthma, uncomplicated: Secondary | ICD-10-CM | POA: Diagnosis not present

## 2023-07-10 MED ORDER — PREDNISONE 20 MG PO TABS: ORAL_TABLET | ORAL | 0 refills | Status: AC

## 2023-07-10 MED ORDER — ALBUTEROL SULFATE HFA 108 (90 BASE) MCG/ACT IN AERS: INHALATION_SPRAY | RESPIRATORY_TRACT | 11 refills | Status: AC

## 2023-07-10 NOTE — Progress Notes (Signed)
@Patient  ID: Ronald Anderson, male    DOB: 09/01/1986, 37 y.o.   MRN: [id number]  Chief Complaint  Patient presents with   Follow-up    Referring provider: [name], MD  HPI:   37 y.o. man whom we are seeing in follow up  for evaluation of dyspnea on exertion and asthma.  Multiple pharmacy notes reviewed.  He is doing okay.  Heat historically does bad.  Breathing worsens.  Significant dyspnea currently with the hot weather outside.  When he is in air conditioning breathing much better.  Good adherence to Nucala.  Misses doses of Breztri from time to time.  Definitely notices worsening symptoms when he misses it and better symptoms when he takes it but with the heat still has residual symptoms when he remembers to continue to take the medication.  HPI at initial visit:  Longstanding history of asthma.  Diagnosed many years ago.  Had a relatively stable on high-dose Advair discus.  However last several years has had progressive worsening of symptoms.  Does not like it there is working.  Discretion is dyspnea, cough with exertion.  Leads to wheezing.  Can use albuterol with gradual improvement but not 200%.  Overall worsening the last several years.  Recently tried air duo high-dose without improvement.  No inciting event or trigger to the symptoms.  No other relieving or exacerbating factors.  No time of day when things are better or worse.  No positions that make things better or worse. No seasonal or environmental factors that he can find to make things better or worse.  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  Dogs in the house.  Antihistamines greatly improved the symptoms.  Reviewed echocardiogram 12/2020 that is grossly normal.  Review chest x-ray 01/11/2020 on my review and interpretation reveals mild hyperinflation without infiltrate or abnormality otherwise.  Reviewed prior most recent chest x-ray 2011 on my review and interpretation reveals similar  hyperinflation, more soft tissue recently compared to 2011.  reviewed recent lab work 10/22 with eosinophils 200, IgE 59.  PMH: Asthma, hypertension Surgical history: Wisdom tooth extraction Family history: CAD in mother, father with CAD, stroke, hypertension Social history: Never smoker, lives in [place]  Questionaires / Pulmonary Flowsheets:   ACT:  Asthma Control Test ACT Total Score  10/23/2022  2:05 PM 22  12/04/2021  9:37 AM 17  10/17/2021  9:59 AM 9    MMRC:     No data to display          Epworth:      No data to display          Tests:   FENO:  No results found for: "NITRICOXIDE"  PFT:     No data to display          WALK:      No data to display          Imaging: Personally reviewed and as per EMR discussion of this note  Lab Results: Personally reviewed CBC    Component Value Date/Time   WBC 14.8 (H) 06/15/2023 1442   RBC 4.91 06/15/2023 1442   HGB 15.4 06/15/2023 1442   HGB 14.8 09/03/2022 1032   HCT 45.0 06/15/2023 1442   HCT 43.7 09/03/2022 1032   PLT 197 06/15/2023 1442   PLT 226 09/03/2022 1032   MCV 91.6 06/15/2023 1442   MCV 95 09/03/2022 1032   MCH 31.4 06/15/2023 1442   MCHC 34.2  06/15/2023 1442   RDW 13.5 06/15/2023 1442   RDW 11.9 09/03/2022 1032   LYMPHSABS 1.5 06/15/2023 1442   LYMPHSABS 1.1 09/03/2022 1032   MONOABS 0.8 06/15/2023 1442   EOSABS 0.0 06/15/2023 1442   EOSABS 0.0 09/03/2022 1032   BASOSABS 0.0 06/15/2023 1442   BASOSABS 0.0 09/03/2022 1032    BMET    Component Value Date/Time   NA 136 06/15/2023 1442   NA 138 09/03/2022 1032   K 4.0 06/15/2023 1442   CL 102 06/15/2023 1442   CO2 24 06/15/2023 1442   GLUCOSE 126 (H) 06/15/2023 1442   BUN 19 06/15/2023 1442   BUN 12 09/03/2022 1032   CREATININE 1.18 06/15/2023 1442   CALCIUM 9.0 06/15/2023 1442   GFRNONAA >60 06/15/2023 1442   GFRAA >60 01/11/2020 0121    BNP No results found for: "BNP"  ProBNP No results found for:  "PROBNP"  Specialty Problems       Pulmonary Problems   Asthma   Allergic rhinitis   OSA (obstructive sleep apnea)    Allergies  Allergen Reactions   Flonase [Fluticasone] Other (See Comments)    migrianes   Penicillins Hives    Immunization History  Administered Date(s) Administered   Influenza Split 09/28/2014   Influenza,inj,Quad PF,6+ Mos 10/13/2017, 09/03/2022   Influenza-Unspecified 09/22/2014, 10/31/2016, 11/23/2018, 12/25/2019   Moderna SARS-COV2 Booster Vaccination 04/24/2021   Moderna Sars-Covid-2 Vaccination 07/11/2020, 08/08/2020   Tdap 12/24/2010, 04/24/2021    Past Medical History:  Diagnosis Date   Allergic rhinitis    Asthma    Dysthymia    GERD (gastroesophageal reflux disease)    Hypertension     Tobacco History: Social History   Tobacco Use  Smoking Status Never  Smokeless Tobacco Never   Counseling given: Not Answered   Continue to not smoke  Outpatient Encounter Medications as of 07/10/2023  Medication Sig   ADDERALL XR 25 MG 24 hr capsule Take by mouth daily.   Budeson-Glycopyrrol-Formoterol (BREZTRI AEROSPHERE) 160-9-4.8 MCG/ACT AERO Inhale 2 puffs into the lungs in the morning and at bedtime.   cetirizine (ZYRTEC) 10 MG tablet Take 10 mg by mouth daily.   gabapentin (NEURONTIN) 100 MG capsule Take 300 mg by mouth.   hydrochlorothiazide (HYDRODIURIL) 25 MG tablet Take 1 tablet (25 mg total) by mouth daily.   Mepolizumab (NUCALA) 100 MG/ML SOAJ INJECT 100MG  SUBCUTANEOUSLY  EVERY 4 WEEKS   montelukast (SINGULAIR) 10 MG tablet Take 1 tablet (10 mg total) by mouth at bedtime.   Multiple Vitamin (MULTIVITAMIN) tablet Take 1 tablet by mouth daily.   NEEDLE, DISP, 18 G 18G X 1" MISC 1 Needle by Does not apply route every 14 (fourteen) days.   NEEDLE, DISP, 21 G 21G X 1" MISC 1 Needle by Does not apply route every 14 (fourteen) days.   omeprazole (PRILOSEC OTC) 20 MG tablet Take 1 tablet (20 mg total) by mouth 2 (two) times daily.    predniSONE (DELTASONE) 20 MG tablet Take 2 tablets (40 mg total) by mouth daily with breakfast for 5 days, THEN 1 tablet (20 mg total) daily with breakfast for 5 days.   SYRINGE-NEEDLE, DISP, 3 ML 21G X 1" 3 ML MISC 1 each by Does not apply route every 14 (fourteen) days.   testosterone cypionate (DEPOTESTOSTERONE CYPIONATE) 200 MG/ML injection Inject 1 mL (200 mg total) into the muscle every 14 (fourteen) days.   [DISCONTINUED] albuterol (VENTOLIN HFA) 108 (90 Base) MCG/ACT inhaler INHALE 2 PUFFS INTO THE LUNGS EVERY 6 (  SIX) HOURS AS NEEDED FOR WHEEZING.   albuterol (VENTOLIN HFA) 108 (90 Base) MCG/ACT inhaler INHALE 2 PUFFS INTO THE LUNGS EVERY 6 (SIX) HOURS AS NEEDED FOR WHEEZING.   [DISCONTINUED] amLODipine (NORVASC) 10 MG tablet TAKE 1 TABLET(10 MG) BY MOUTH DAILY   [DISCONTINUED] venlafaxine (EFFEXOR) 75 MG tablet Take 75 mg by mouth daily.   [DISCONTINUED] venlafaxine XR (EFFEXOR-XR) 150 MG 24 hr capsule Take 150 mg by mouth daily.   No facility-administered encounter medications on file as of 07/10/2023.     Review of Systems  N/a Physical Exam  BP 132/86   Pulse 87   Ht 6' (1.829 m)   Wt 235 lb (106.6 kg)   SpO2 97%   BMI 31.87 kg/m   Wt Readings from Last 5 Encounters:  07/10/23 235 lb (106.6 kg)  06/15/23 230 lb (104.3 kg)  11/19/22 226 lb 6.4 oz (102.7 kg)  10/23/22 223 lb 12.8 oz (101.5 kg)  10/09/22 222 lb 12.8 oz (101.1 kg)    BMI Readings from Last 5 Encounters:  07/10/23 31.87 kg/m  06/15/23 31.19 kg/m  11/19/22 30.71 kg/m  10/23/22 30.35 kg/m  10/09/22 30.22 kg/m     Physical Exam General: Well-appearing, no acute distress Eyes: EOMI, no icterus Neck: Supple, no JVP Pulmonary: Distant, clear, normal work of breathing, no wheeze Cardiovascular: Regular rate and rhythm, no murmur MSK: No synovitis, no joint effusion Neuro: Normal gait, no weakness Psych: Normal mood, full affect   Assessment & Plan:   Dyspnea, cough: Suspect related to poorly  controlled asthma.  Chest imaging clear with exception of hyper inflation.  High-dose ICS/LABA without improvement.  Prior spirometry with fixed obstruction 2018.  Mild improvement with triple inhaled therapy via Breztri.  To continue.  Asthma: Historically, previous to [facility], poorly controlled.  On Breztri.  IgE around 50.  Eos 200.  Started Nucala in early 2023.  Improved symptoms.  Albuterol refilled today.  Worsening symptoms of dyspnea lung exam clear.  Trial prednisone taper to see if improves, hot weather is his typical trigger of worsening symptoms.   Return in about 6 months (around 01/10/2024).   Karren Burly, MD 07/10/2023

## 2023-07-10 NOTE — Patient Instructions (Addendum)
Nice to see you again  Take prednisone as prescribed lets see if this helps  Continue Breztri twice a day   I refilled albuterol requested 2 inhalers at a time  Return to clinic in 6 months or sooner

## 2023-07-11 ENCOUNTER — Ambulatory Visit: Payer: 59 | Attending: Cardiology | Admitting: Cardiology

## 2023-07-11 ENCOUNTER — Encounter: Payer: Self-pay | Admitting: Cardiology

## 2023-07-11 VITALS — BP 142/100 | HR 80 | Ht 72.0 in | Wt 234.0 lb

## 2023-07-11 DIAGNOSIS — Z131 Encounter for screening for diabetes mellitus: Secondary | ICD-10-CM

## 2023-07-11 DIAGNOSIS — I10 Essential (primary) hypertension: Secondary | ICD-10-CM | POA: Diagnosis not present

## 2023-07-11 DIAGNOSIS — I2510 Atherosclerotic heart disease of native coronary artery without angina pectoris: Secondary | ICD-10-CM

## 2023-07-11 MED ORDER — VALSARTAN 40 MG PO TABS: 40.0000 mg | ORAL_TABLET | Freq: Every day | ORAL | 3 refills | Status: DC

## 2023-07-11 NOTE — Patient Instructions (Addendum)
Medication Instructions:  Start Valsartan 40 mg Daily  *If you need a refill on your cardiac medications before your next appointment, please call your pharmacy*   Lab Work: CMET  MAG LIPID HgA1C  If you have labs (blood work) drawn today and your tests are completely normal, you will receive your results only by: MyChart Message (if you have MyChart) OR A paper copy in the mail If you have any lab test that is abnormal or we need to change your treatment, we will call you to review the results.   Testing/Procedures: has ordered a CT coronary calcium score.   Test locations:  [facility]  [facility]  This is $99 out of pocket.   Coronary CalciumScan A coronary calcium scan is an imaging test used to look for deposits of calcium and other fatty materials (plaques) in the inner lining of the blood vessels of the heart (coronary arteries). These deposits of calcium and plaques can partly clog and narrow the coronary arteries without producing any symptoms or warning signs. This puts a person at risk for a heart attack. This test can detect these deposits before symptoms develop. Tell a health care provider about: Any allergies you have. All medicines you are taking, including vitamins, herbs, eye drops, creams, and over-the-counter medicines. Any problems you or family members have had with anesthetic medicines. Any blood disorders you have. Any surgeries you have had. Any medical conditions you have. Whether you are pregnant or may be pregnant. What are the risks? Generally, this is a safe procedure. However, problems may occur, including: Harm to a pregnant woman and her unborn baby. This test involves the use of radiation. Radiation exposure can be dangerous to a pregnant woman and her unborn baby. If you are pregnant, you generally should not have this procedure done. Slight  increase in the risk of cancer. This is because of the radiation involved in the test. What happens before the procedure? No preparation is needed for this procedure. What happens during the procedure? You will undress and remove any jewelry around your neck or chest. You will put on a hospital gown. Sticky electrodes will be placed on your chest. The electrodes will be connected to an electrocardiogram (ECG) machine to record a tracing of the electrical activity of your heart. A CT scanner will take pictures of your heart. During this time, you will be asked to lie still and hold your breath for 2-3 seconds while a picture of your heart is being taken. The procedure may vary among health care providers and hospitals. What happens after the procedure? You can get dressed. You can return to your normal activities. It is up to you to get the results of your test. Ask your health care provider, or the department that is doing the test, when your results will be ready. Summary A coronary calcium scan is an imaging test used to look for deposits of calcium and other fatty materials (plaques) in the inner lining of the blood vessels of the heart (coronary arteries). Generally, this is a safe procedure. Tell your health care provider if you are pregnant or may be pregnant. No preparation is needed for this procedure. A CT scanner will take pictures of your heart. You can return to your normal activities after the scan is done. This information is not intended to replace advice given to you by your health care provider. Make sure you discuss  any questions you have with your health care provider. Document Released: 06/07/2008 Document Revised: 10/29/2016 Document Reviewed: 10/29/2016 Elsevier Interactive Patient Education  2017 [employer].    Test locations:  [facility]  [facility]  This is $99 out of  pocket.   Coronary CalciumScan   Follow-Up: At [facility], you and your health needs are our priority.  As part of our continuing mission to provide you with exceptional heart care, we have created designated Provider Care Teams.  These Care Teams include your primary Cardiologist (physician) and Advanced Practice Providers (APPs -  Physician Assistants and Nurse Practitioners) who all work together to provide you with the care you need, when you need it.  We recommend signing up for the patient portal called "MyChart".  Sign up information is provided on this After Visit Summary.  MyChart is used to connect with patients for Virtual Visits (Telemedicine).  Patients are able to view lab/test results, encounter notes, upcoming appointments, etc.  Non-urgent messages can be sent to your provider as well.   To learn more about what you can do with MyChart, go to [email or web address].    Your next appointment:   18-20 week(s)  Provider:   Thomasene Ripple, DO    Pharmacy referral [phone]

## 2023-07-11 NOTE — Progress Notes (Signed)
Cardiology Office Note:    Date:  07/12/2023   ID:  Ronald Anderson, DOB 1986/08/04, MRN [id number]  PCP:  [name], MD  Cardiologist:  Thomasene Ripple, DO  Electrophysiologist:  None   Referring MD: Glyn Ade, MD    " I am under a lot of stress"  History of Present Illness:    Ronald Anderson is a 37 y.o. male with a hx of upper lipidemia based on lipid profile which was done in September 2023 hypertension which was diagnosed several years ago he has been placed on amlodipine which she did not tolerate due to significant swelling, olmesartan was also taken off this medicine. He had follow-up with [facility] cardiology at which time he 2021 he had an echocardiogram done which was normal.  He was referred by his PCP because he recently was seen in the emergency department for significant hypertension.  During his ED visit he was started on hydrochlorothiazide 25 mg daily.    Past Medical History:  Diagnosis Date   Allergic rhinitis    Asthma    Dysthymia    GERD (gastroesophageal reflux disease)    Hypertension     Past Surgical History:  Procedure Laterality Date   WISDOM TOOTH EXTRACTION      Current Medications: Current Meds  Medication Sig   ADDERALL XR 25 MG 24 hr capsule Take by mouth daily.   albuterol (VENTOLIN HFA) 108 (90 Base) MCG/ACT inhaler INHALE 2 PUFFS INTO THE LUNGS EVERY 6 (SIX) HOURS AS NEEDED FOR WHEEZING.   Budeson-Glycopyrrol-Formoterol (BREZTRI AEROSPHERE) 160-9-4.8 MCG/ACT AERO Inhale 2 puffs into the lungs in the morning and at bedtime.   cetirizine (ZYRTEC) 10 MG tablet Take 10 mg by mouth daily.   gabapentin (NEURONTIN) 100 MG capsule Take 300 mg by mouth.   Mepolizumab (NUCALA) 100 MG/ML SOAJ INJECT 100MG  SUBCUTANEOUSLY  EVERY 4 WEEKS   montelukast (SINGULAIR) 10 MG tablet Take 1 tablet (10 mg total) by mouth at bedtime.   Multiple Vitamin (MULTIVITAMIN) tablet Take 1 tablet by mouth daily.   NEEDLE, DISP, 18 G 18G X 1" MISC 1 Needle by  Does not apply route every 14 (fourteen) days.   NEEDLE, DISP, 21 G 21G X 1" MISC 1 Needle by Does not apply route every 14 (fourteen) days.   omeprazole (PRILOSEC OTC) 20 MG tablet Take 1 tablet (20 mg total) by mouth 2 (two) times daily.   predniSONE (DELTASONE) 20 MG tablet Take 2 tablets (40 mg total) by mouth daily with breakfast for 5 days, THEN 1 tablet (20 mg total) daily with breakfast for 5 days.   SYRINGE-NEEDLE, DISP, 3 ML 21G X 1" 3 ML MISC 1 each by Does not apply route every 14 (fourteen) days.   testosterone cypionate (DEPOTESTOSTERONE CYPIONATE) 200 MG/ML injection Inject 1 mL (200 mg total) into the muscle every 14 (fourteen) days.   valsartan (DIOVAN) 40 MG tablet Take 1 tablet (40 mg total) by mouth daily.   [DISCONTINUED] hydrochlorothiazide (HYDRODIURIL) 25 MG tablet Take 1 tablet (25 mg total) by mouth daily.     Allergies:   Penicillins   Social History   Socioeconomic History   Marital status: Married    Spouse name: Not on file   Number of children: 1   Years of education: Not on file   Highest education level: Not on file  Occupational History   Occupation: Curator    Employer: [employer]  Tobacco Use   Smoking status: Never  Smokeless tobacco: Never  Vaping Use   Vaping status: Never Used  Substance and Sexual Activity   Alcohol use: Yes    Comment: 6 beers per month   Drug use: No   Sexual activity: Not on file  Other Topics Concern   Not on file  Social History Narrative   Not on file   Social Determinants of Health   Financial Resource Strain: Not on file  Food Insecurity: Not on file  Transportation Needs: Not on file  Physical Activity: Not on file  Stress: Not on file  Social Connections: Unknown (12/27/2022)   Received from [facility]   Social Network    Social Network: Not on file     Family History: The patient's family history includes Heart attack (age of onset: 32) in his father; Heart disease in his mother; Hypertension  in his father; Stroke in his father.  ROS:   Review of Systems  Constitution: Negative for decreased appetite, fever and weight gain.  HENT: Negative for congestion, ear discharge, hoarse voice and sore throat.   Eyes: Negative for discharge, redness, vision loss in right eye and visual halos.  Cardiovascular: Negative for chest pain, dyspnea on exertion, leg swelling, orthopnea and palpitations.  Respiratory: Negative for cough, hemoptysis, shortness of breath and snoring.   Endocrine: Negative for heat intolerance and polyphagia.  Hematologic/Lymphatic: Negative for bleeding problem. Does not bruise/bleed easily.  Skin: Negative for flushing, nail changes, rash and suspicious lesions.  Musculoskeletal: Negative for arthritis, joint pain, muscle cramps, myalgias, neck pain and stiffness.  Gastrointestinal: Negative for abdominal pain, bowel incontinence, diarrhea and excessive appetite.  Genitourinary: Negative for decreased libido, genital sores and incomplete emptying.  Neurological: Negative for brief paralysis, focal weakness, headaches and loss of balance.  Psychiatric/Behavioral: Negative for altered mental status, depression and suicidal ideas.  Allergic/Immunologic: Negative for HIV exposure and persistent infections.    EKGs/Labs/Other Studies Reviewed:    The following studies were reviewed today:   EKG:  The ekg ordered today demonstrates   Recent Labs: 09/03/2022: TSH 1.890 11/14/2022: Magnesium 2.0 06/15/2023: ALT 42; BUN 19; Creatinine, Ser 1.18; Hemoglobin 15.4; Platelets 197; Potassium 4.0; Sodium 136  Recent Lipid Panel    Component Value Date/Time   CHOL 190 09/03/2022 1032   TRIG 213 (H) 09/03/2022 1032   HDL 35 (L) 09/03/2022 1032   CHOLHDL 5.4 (H) 09/03/2022 1032   LDLCALC 117 (H) 09/03/2022 1032    Physical Exam:    VS:  BP (!) 142/100   Pulse 80   Ht 6' (1.829 m)   Wt 234 lb (106.1 kg)   SpO2 97%   BMI 31.74 kg/m     Wt Readings from Last 3  Encounters:  07/11/23 234 lb (106.1 kg)  07/10/23 235 lb (106.6 kg)  06/15/23 230 lb (104.3 kg)     GEN: Well nourished, well developed in no acute distress HEENT: Normal NECK: No JVD; No carotid bruits LYMPHATICS: No lymphadenopathy CARDIAC: S1S2 noted,RRR, no murmurs, rubs, gallops RESPIRATORY:  Clear to auscultation without rales, wheezing or rhonchi  ABDOMEN: Soft, non-tender, non-distended, +bowel sounds, no guarding. EXTREMITIES: No edema, No cyanosis, no clubbing MUSCULOSKELETAL:  No deformity  SKIN: Warm and dry NEUROLOGIC:  Alert and oriented x 3, non-focal PSYCHIATRIC:  Normal affect, good insight  ASSESSMENT:    1. Hypertension, unspecified type   2. Diabetes mellitus screening   3. Arteriosclerotic cardiovascular disease (ASCVD)    PLAN:     He is hypertensive in the  office today.  I will keep him on his hydrochlorothiazide 25 mg daily, add valsartan 40 mg daily.  Will titrate his medication as appropriate.  He will see our pharmacy colleagues in 4 to 6 weeks for medication titration visit. In addition he has hyperlipidemia reviewed his lipid profile in September 2023 shows total cholesterol 190, HDL 35, LDL 117, triglyceride 213 we will repeat his lipid profile today.  We also talked about getting a coronary calcification he has premature family history of coronary artery disease in his father first MI in his early 84s.  He is agreeable he understands that this is self-pay.  Will screen him for diabetes as well.  Consider weight loss   The patient is in agreement with the above plan. The patient left the office in stable condition.  The patient will follow up in   Medication Adjustments/Labs and Tests Ordered: Current medicines are reviewed at length with the patient today.  Concerns regarding medicines are outlined above.  Orders Placed This Encounter  Procedures   CT CARDIAC SCORING (SELF PAY ONLY)   Comprehensive metabolic panel   Magnesium   Lipid panel    Hemoglobin A1c   AMB Referral to [facility]   VAS US RENAL ARTERY DUPLEX   Meds ordered this encounter  Medications   valsartan (DIOVAN) 40 MG tablet    Sig: Take 1 tablet (40 mg total) by mouth daily.    Dispense:  90 tablet    Refill:  3    Patient Instructions  Medication Instructions:  Start Valsartan 40 mg Daily  *If you need a refill on your cardiac medications before your next appointment, please call your pharmacy*   Lab Work: CMET  MAG LIPID HgA1C  If you have labs (blood work) drawn today and your tests are completely normal, you will receive your results only by: MyChart Message (if you have MyChart) OR A paper copy in the mail If you have any lab test that is abnormal or we need to change your treatment, we will call you to review the results.   Testing/Procedures: has ordered a CT coronary calcium score.   Test locations:  [facility]  [place] [facility] at [facility]  This is $99 out of pocket.   Coronary CalciumScan A coronary calcium scan is an imaging test used to look for deposits of calcium and other fatty materials (plaques) in the inner lining of the blood vessels of the heart (coronary arteries). These deposits of calcium and plaques can partly clog and narrow the coronary arteries without producing any symptoms or warning signs. This puts a person at risk for a heart attack. This test can detect these deposits before symptoms develop. Tell a health care provider about: Any allergies you have. All medicines you are taking, including vitamins, herbs, eye drops, creams, and over-the-counter medicines. Any problems you or family members have had with anesthetic medicines. Any blood disorders you have. Any surgeries you have had. Any medical conditions you have. Whether you are pregnant or may be pregnant. What are the risks? Generally, this is a safe procedure.  However, problems may occur, including: Harm to a pregnant woman and her unborn baby. This test involves the use of radiation. Radiation exposure can be dangerous to a pregnant woman and her unborn baby. If you are pregnant, you generally should not have this procedure done. Slight increase in the risk of cancer. This is because of the radiation  involved in the test. What happens before the procedure? No preparation is needed for this procedure. What happens during the procedure? You will undress and remove any jewelry around your neck or chest. You will put on a hospital gown. Sticky electrodes will be placed on your chest. The electrodes will be connected to an electrocardiogram (ECG) machine to record a tracing of the electrical activity of your heart. A CT scanner will take pictures of your heart. During this time, you will be asked to lie still and hold your breath for 2-3 seconds while a picture of your heart is being taken. The procedure may vary among health care providers and hospitals. What happens after the procedure? You can get dressed. You can return to your normal activities. It is up to you to get the results of your test. Ask your health care provider, or the department that is doing the test, when your results will be ready. Summary A coronary calcium scan is an imaging test used to look for deposits of calcium and other fatty materials (plaques) in the inner lining of the blood vessels of the heart (coronary arteries). Generally, this is a safe procedure. Tell your health care provider if you are pregnant or may be pregnant. No preparation is needed for this procedure. A CT scanner will take pictures of your heart. You can return to your normal activities after the scan is done. This information is not intended to replace advice given to you by your health care provider. Make sure you discuss any questions you have with your health care provider. Document Released: 06/07/2008  Document Revised: 10/29/2016 Document Reviewed: 10/29/2016 Elsevier Interactive Patient Education  2017 [employer].    Test locations:  [facility]  [place] [facility]  This is $99 out of pocket.   Coronary CalciumScan   Follow-Up: At [facility], you and your health needs are our priority.  As part of our continuing mission to provide you with exceptional heart care, we have created designated Provider Care Teams.  These Care Teams include your primary Cardiologist (physician) and Advanced Practice Providers (APPs -  Physician Assistants and Nurse Practitioners) who all work together to provide you with the care you need, when you need it.  We recommend signing up for the patient portal called "MyChart".  Sign up information is provided on this After Visit Summary.  MyChart is used to connect with patients for Virtual Visits (Telemedicine).  Patients are able to view lab/test results, encounter notes, upcoming appointments, etc.  Non-urgent messages can be sent to your provider as well.   To learn more about what you can do with MyChart, go to [email or web address].    Your next appointment:   18-20 week(s)  Provider:   Thomasene Ripple, DO    Pharmacy referral 4-6wks   Adopting a Healthy Lifestyle.  Know what a healthy weight is for you (roughly BMI <25) and aim to maintain this   Aim for 7+ servings of fruits and vegetables daily   65-80+ fluid ounces of water or unsweet tea for healthy kidneys   Limit to max 1 drink of alcohol per day; avoid smoking/tobacco   Limit animal fats in diet for cholesterol and heart health - choose grass fed whenever available   Avoid highly processed foods, and foods high in saturated/trans fats   Aim for low stress - take time to unwind and care for your mental health  Aim for 150 min of moderate intensity exercise weekly for heart health, and  weights twice weekly for bone health   Aim for 7-9 hours of sleep daily   When it comes to diets, agreement about the perfect plan isnt easy to find, even among the experts. Experts at the [facility] of [employer] developed an idea known as the Healthy Eating Plate. Just imagine a plate divided into logical, healthy portions.   The emphasis is on diet quality:   Load up on vegetables and fruits - one-half of your plate: Aim for color and variety, and remember that potatoes dont count.   Go for whole grains - one-quarter of your plate: Whole wheat, barley, wheat berries, quinoa, oats, brown rice, and foods made with them. If you want pasta, go with whole wheat pasta.   Protein power - one-quarter of your plate: Fish, chicken, beans, and nuts are all healthy, versatile protein sources. Limit red meat.   The diet, however, does go beyond the plate, offering a few other suggestions.   Use healthy plant oils, such as olive, canola, soy, corn, sunflower and peanut. Check the labels, and avoid partially hydrogenated oil, which have unhealthy trans fats.   If youre thirsty, drink water. Coffee and tea are good in moderation, but skip sugary drinks and limit milk and dairy products to one or two daily servings.   The type of carbohydrate in the diet is more important than the amount. Some sources of carbohydrates, such as vegetables, fruits, whole grains, and beans-are healthier than others.   Finally, stay active  Signed, Thomasene Ripple, DO  07/12/2023 1:15 PM    [facility]

## 2023-07-12 MED ORDER — HYDROCHLOROTHIAZIDE 25 MG PO TABS: 25.0000 mg | ORAL_TABLET | Freq: Every day | ORAL | 3 refills | Status: DC

## 2023-07-17 LAB — MAGNESIUM: Magnesium: 2.1 mg/dL (ref 1.6–2.3)

## 2023-07-17 LAB — COMPREHENSIVE METABOLIC PANEL
ALT: 33 IU/L (ref 0–44)
AST: 20 IU/L (ref 0–40)
Albumin: 4.5 g/dL (ref 4.1–5.1)
Alkaline Phosphatase: 64 IU/L (ref 44–121)
BUN/Creatinine Ratio: 23 — ABNORMAL HIGH (ref 9–20)
BUN: 25 mg/dL — ABNORMAL HIGH (ref 6–20)
Bilirubin Total: 0.5 mg/dL (ref 0.0–1.2)
CO2: 25 mmol/L (ref 20–29)
Calcium: 9.7 mg/dL (ref 8.7–10.2)
Chloride: 100 mmol/L (ref 96–106)
Creatinine, Ser: 1.09 mg/dL (ref 0.76–1.27)
Globulin, Total: 2.5 g/dL (ref 1.5–4.5)
Glucose: 95 mg/dL (ref 70–99)
Potassium: 4.1 mmol/L (ref 3.5–5.2)
Sodium: 140 mmol/L (ref 134–144)
Total Protein: 7 g/dL (ref 6.0–8.5)
eGFR: 89 mL/min/{1.73_m2} (ref >59)

## 2023-07-17 LAB — LIPID PANEL
Chol/HDL Ratio: 4.7 ratio (ref 0.0–5.0)
Cholesterol, Total: 215 mg/dL — ABNORMAL HIGH (ref 100–199)
HDL: 46 mg/dL (ref >39)
LDL Chol Calc (NIH): 136 mg/dL — ABNORMAL HIGH (ref 0–99)
Triglycerides: 186 mg/dL — ABNORMAL HIGH (ref 0–149)
VLDL Cholesterol Cal: 33 mg/dL (ref 5–40)

## 2023-07-17 LAB — HEMOGLOBIN A1C
Est. average glucose Bld gHb Est-mCnc: 123 mg/dL
Hgb A1c MFr Bld: 5.9 % — ABNORMAL HIGH (ref 4.8–5.6)

## 2023-07-19 ENCOUNTER — Other Ambulatory Visit: Payer: Self-pay | Admitting: Family Medicine

## 2023-07-19 DIAGNOSIS — J4530 Mild persistent asthma, uncomplicated: Secondary | ICD-10-CM

## 2023-07-19 DIAGNOSIS — J309 Allergic rhinitis, unspecified: Secondary | ICD-10-CM

## 2023-07-19 MED ORDER — MONTELUKAST SODIUM 10 MG PO TABS
10.0000 mg | ORAL_TABLET | Freq: Every day | ORAL | 0 refills | Status: DC
Start: 2023-07-19 — End: 2023-10-22

## 2023-07-23 ENCOUNTER — Other Ambulatory Visit: Payer: Self-pay | Admitting: Pulmonary Disease

## 2023-07-23 DIAGNOSIS — J4540 Moderate persistent asthma, uncomplicated: Secondary | ICD-10-CM

## 2023-07-23 NOTE — Telephone Encounter (Signed)
Refill sent for [facility] to [facility]: [phone]   Dose: 100 mg SQ every 4 weeks  Last OV: 07/10/23 Provider: Dr. Judeth Horn  Next OV: not yet scheduled (due in 6 months)  Chesley Mires, PharmD, MPH, BCPS Clinical Pharmacist (Rheumatology and Pulmonology)   Chesley Mires, PharmD, MPH, BCPS, CPP Clinical Pharmacist (Rheumatology and Pulmonology)

## 2023-07-25 ENCOUNTER — Ambulatory Visit (HOSPITAL_COMMUNITY)
Admission: RE | Admit: 2023-07-25 | Discharge: 2023-07-25 | Disposition: A | Discharge status: Home / Self Care | Payer: 59 | Source: Ambulatory Visit | Attending: Cardiology | Admitting: Cardiology

## 2023-07-25 ENCOUNTER — Other Ambulatory Visit: Payer: Self-pay

## 2023-07-25 DIAGNOSIS — I10 Essential (primary) hypertension: Secondary | ICD-10-CM | POA: Insufficient documentation

## 2023-07-25 DIAGNOSIS — I2510 Atherosclerotic heart disease of native coronary artery without angina pectoris: Secondary | ICD-10-CM | POA: Insufficient documentation

## 2023-07-25 MED ORDER — ATORVASTATIN CALCIUM 10 MG PO TABS: 10.0000 mg | ORAL_TABLET | Freq: Every evening | ORAL | 3 refills | Status: DC

## 2023-07-26 ENCOUNTER — Ambulatory Visit (HOSPITAL_BASED_OUTPATIENT_CLINIC_OR_DEPARTMENT_OTHER)
Admission: RE | Admit: 2023-07-26 | Discharge: 2023-07-26 | Disposition: A | Discharge status: Home / Self Care | Payer: 59 | Source: Ambulatory Visit | Attending: Cardiology | Admitting: Cardiology

## 2023-07-26 DIAGNOSIS — I10 Essential (primary) hypertension: Secondary | ICD-10-CM

## 2023-08-09 ENCOUNTER — Ambulatory Visit: Payer: 59 | Admitting: Pharmacist

## 2023-08-09 ENCOUNTER — Encounter: Payer: Self-pay | Admitting: Pharmacist

## 2023-08-09 VITALS — BP 155/95 | HR 90 | Wt 241.6 lb

## 2023-08-09 DIAGNOSIS — F419 Anxiety disorder, unspecified: Secondary | ICD-10-CM | POA: Insufficient documentation

## 2023-08-09 DIAGNOSIS — I10 Essential (primary) hypertension: Secondary | ICD-10-CM

## 2023-08-09 DIAGNOSIS — F909 Attention-deficit hyperactivity disorder, unspecified type: Secondary | ICD-10-CM | POA: Insufficient documentation

## 2023-08-09 DIAGNOSIS — F32A Depression, unspecified: Secondary | ICD-10-CM | POA: Insufficient documentation

## 2023-08-09 MED ORDER — VALSARTAN 80 MG PO TABS
80.0000 mg | ORAL_TABLET | Freq: Every day | ORAL | 2 refills | Status: DC
Start: 2023-08-09 — End: 2023-11-12

## 2023-08-09 NOTE — Progress Notes (Unsigned)
Patient ID: Ronald Anderson                 DOB: April 16, 1986                      MRN: [id number]     HPI: Ronald Anderson is a 37 y.o. male referred by Dr. Servando Salina to HTN clinic. PMH is significant for HTN, OSA, pre DM, asthma and ADHD.   Reported to ER on 06/15/23 for elevated blood pressure and was started on hydrochlorothiazide. Valsartan added after first visit with Dr Servando Salina.  Patient presents today for HTN follow up. Has an advanced home BP cuff made by [employer] that connects with his phone and watch and tracks his trends. Brought cuff for validation. Home BP cuff result: 155/95. Appears accurate.  Had appointment with ortho yesterday for chronic back pain. Started on methocarbamol and prednisone. Patient aware pain and steroids can increase BP.  Diet is not ideal due to work schedule. Works 4-5 days a week. Will work between 10-15 hours a day so frequently eats at work and he knows the choices there are not ideal. Has discontinued drinking sodas but still drinks gatorade.   Does not have an exercise routine but is on his feet all day. Showed step counter from app, walks between 7-8 miles per day at work. Has 2 small children at home so is also active outside work. However does not get much sleep. Does not go to bed until between 11pm-12pm and wakes back up around 5am. Works at [employer].  Reports no adverse effects to valsartan.  Current HTN meds:  Hydrochlorothiazide 25mg  daily Valsartan 40mg  daily  Previously tried: amlodipine (swelling) BP goal: <130/80   Wt Readings from Last 3 Encounters:  08/09/23 241 lb 9.6 oz (109.6 kg)  07/11/23 234 lb (106.1 kg)  07/10/23 235 lb (106.6 kg)   BP Readings from Last 3 Encounters:  08/09/23 (!) 155/95  07/11/23 (!) 142/100  07/10/23 132/86   Pulse Readings from Last 3 Encounters:  08/09/23 90  07/11/23 80  07/10/23 87    Renal function: CrCl cannot be calculated (Patient's most recent lab result is older than the  maximum 21 days allowed.).  Past Medical History:  Diagnosis Date   Allergic rhinitis    Asthma    Dysthymia    GERD (gastroesophageal reflux disease)    Hypertension     Current Outpatient Medications on File Prior to Visit  Medication Sig Dispense Refill   methocarbamol (ROBAXIN) 500 MG tablet Take 500 mg by mouth every 6 (six) hours as needed.     predniSONE (DELTASONE) 5 MG tablet Take 5 mg by mouth.     ADDERALL XR 25 MG 24 hr capsule Take by mouth daily.     albuterol (VENTOLIN HFA) 108 (90 Base) MCG/ACT inhaler INHALE 2 PUFFS INTO THE LUNGS EVERY 6 (SIX) HOURS AS NEEDED FOR WHEEZING. 2 each 11   atorvastatin (LIPITOR) 10 MG tablet Take 1 tablet (10 mg total) by mouth at bedtime. 90 tablet 3   Budeson-Glycopyrrol-Formoterol (BREZTRI AEROSPHERE) 160-9-4.8 MCG/ACT AERO Inhale 2 puffs into the lungs in the morning and at bedtime. 16 g 11   cetirizine (ZYRTEC) 10 MG tablet Take 10 mg by mouth daily.     gabapentin (NEURONTIN) 100 MG capsule Take 300 mg by mouth.     hydrochlorothiazide (HYDRODIURIL) 25 MG tablet Take 1 tablet (25 mg total) by mouth daily. 90 tablet 3  Mepolizumab (NUCALA) 100 MG/ML SOAJ INJECT 100MG  SUBCUTANEOUSLY  EVERY 4 WEEKS 1 mL 5   montelukast (SINGULAIR) 10 MG tablet Take 1 tablet (10 mg total) by mouth at bedtime. 90 tablet 0   Multiple Vitamin (MULTIVITAMIN) tablet Take 1 tablet by mouth daily.     NEEDLE, DISP, 18 G 18G X 1" MISC 1 Needle by Does not apply route every 14 (fourteen) days. 100 each 1   NEEDLE, DISP, 21 G 21G X 1" MISC 1 Needle by Does not apply route every 14 (fourteen) days. 100 each 1   omeprazole (PRILOSEC OTC) 20 MG tablet Take 1 tablet (20 mg total) by mouth 2 (two) times daily. 60 tablet 11   SYRINGE-NEEDLE, DISP, 3 ML 21G X 1" 3 ML MISC 1 each by Does not apply route every 14 (fourteen) days. 100 each 1   testosterone cypionate (DEPOTESTOSTERONE CYPIONATE) 200 MG/ML injection Inject 1 mL (200 mg total) into the muscle every 14  (fourteen) days. 10 mL 0   No current facility-administered medications on file prior to visit.    Allergies  Allergen Reactions   Amlodipine Swelling   Penicillins Hives     Assessment/Plan:  1. Hypertension -  Patient BP in room 154/91 which is above goal of <130/80. Home cuff appears accurate. Possible contributing factors include diet which is high in sodium, lack of sleep, and stress.   Recommended trying to change diet and avoid salty foods and drinks like Gatorade. Does not think he will be able to get more sleep due to his schedule and with child care.  Will increase valsartan to 80mg  at this time. Advised he can take two 40mg  tablets until they run out. Will have patient continue to monitor BP at home and send readings over viz myChart for further titration which is likely needed. Patient voiced understanding.  Continue hydrochlorothiazide 25mg  daily Increase valsartan to 80mg  daily  HYPERTENSION CONTROL Vitals:   08/09/23 0832 08/09/23 0908  BP: (!) 154/91 (!) 155/95    The patient's blood pressure is elevated above target today. {Click here if intervention needs to be changed Refresh Note :1}  In order to address the patient's elevated BP: A current anti-hypertensive medication was adjusted today.     Laural Golden, PharmD, BCACP, CDCES, CPP [address], [address] [place], [place], [address] Phone: [phone], Fax: [phone]

## 2023-08-09 NOTE — Patient Instructions (Addendum)
It was nice meeting you today  We would like your blood pressure to be less than 130/80  Please continue your hydrochlorothiazide 25mg  daily  We will increase your valsartan to 80 mg once a day. Please continue to monitor your blood pressure at home and send over your readings through myChart  Try to watch how much salt and sugars you are having in your diet  Let us know if you have any questions  Laural Golden, PharmD, BCACP, CDCES, CPP [address], [address] [place], [place], [address] Phone: [phone], Fax: [phone]

## 2023-08-28 ENCOUNTER — Encounter: Payer: Self-pay | Admitting: Cardiology

## 2023-08-29 ENCOUNTER — Other Ambulatory Visit: Payer: Self-pay | Admitting: Oncology

## 2023-08-29 DIAGNOSIS — Z006 Encounter for examination for normal comparison and control in clinical research program: Secondary | ICD-10-CM

## 2023-10-02 IMAGING — DX DG FOOT COMPLETE 3+V*R*
3 series · 3 of 3 positions shown · non-contrast
Comparison: None Available.

CLINICAL DATA: Glass in right foot.

EXAM:
RIGHT FOOT COMPLETE - 3+ VIEW

[foot ap]
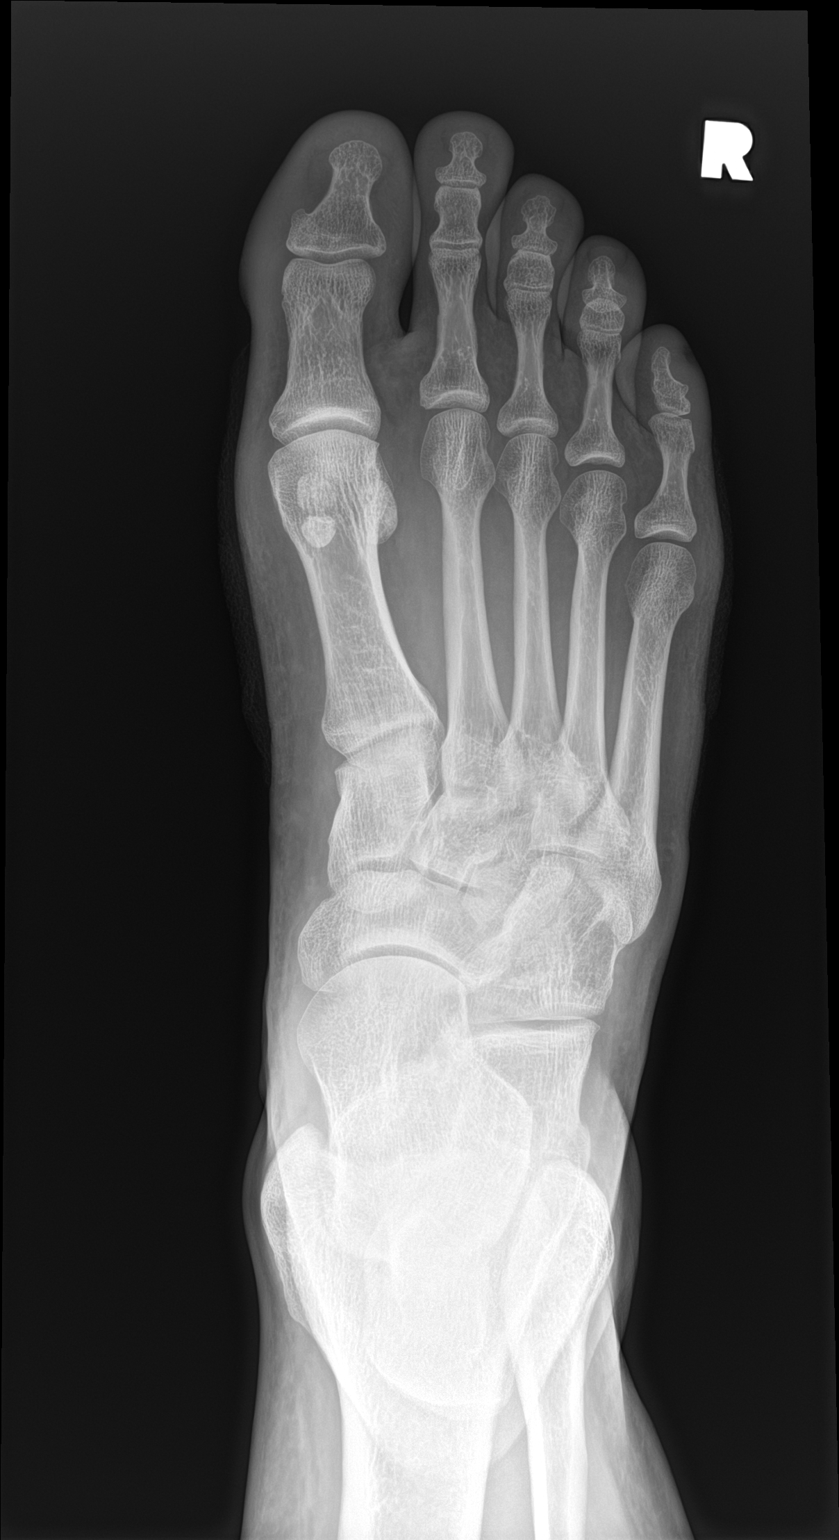

[foot obl]
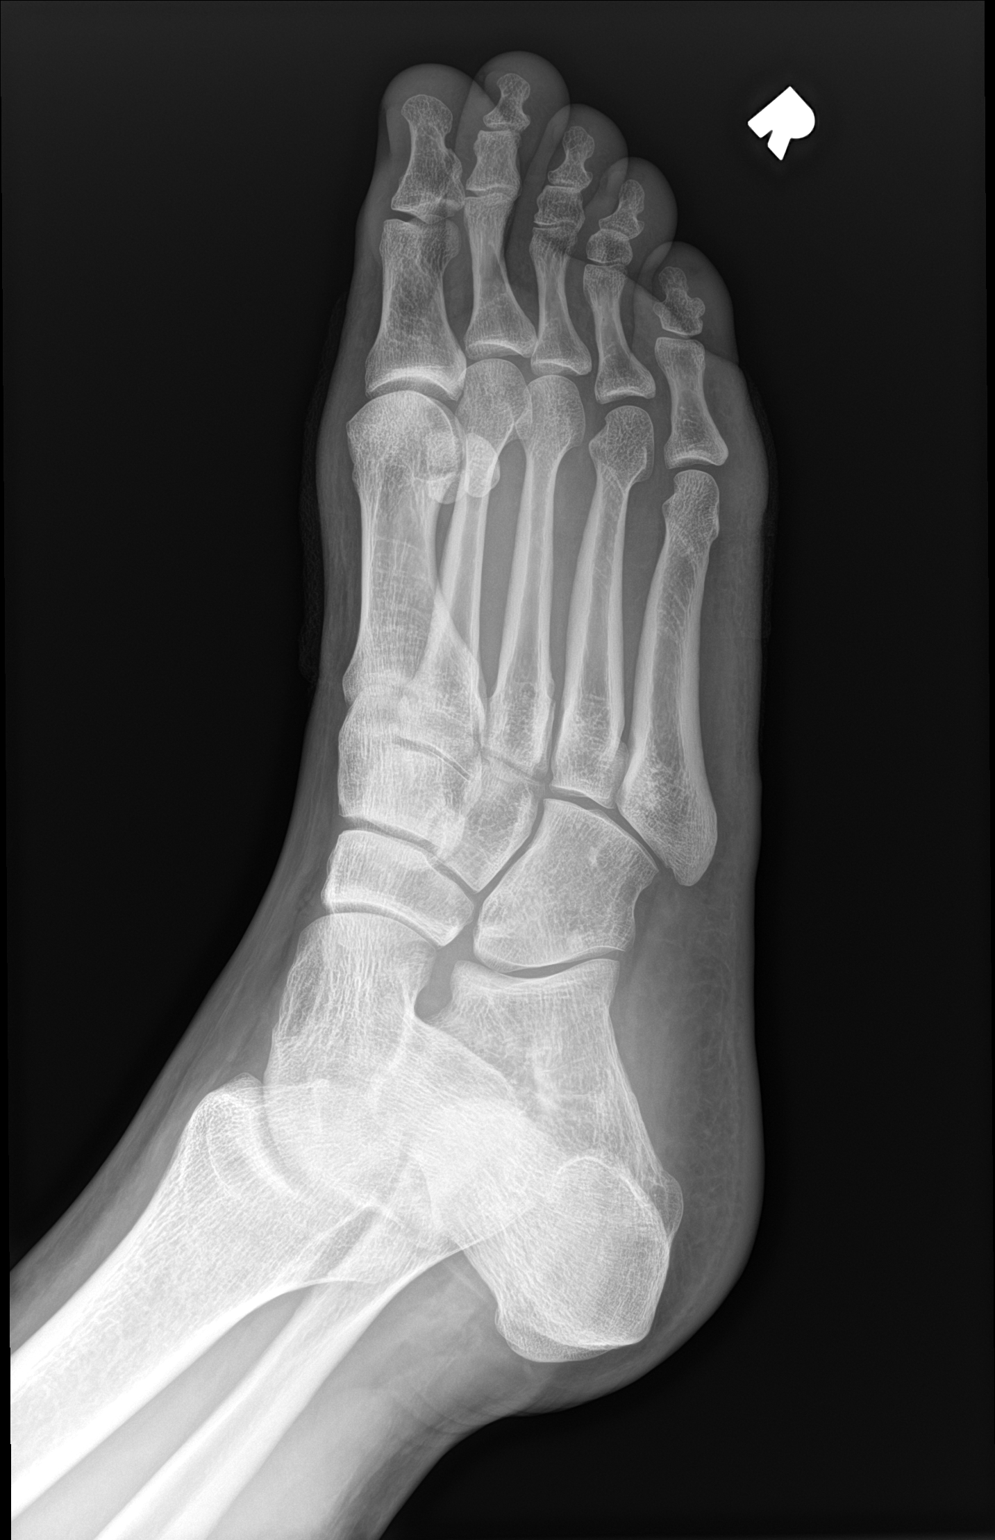

[foot lat]
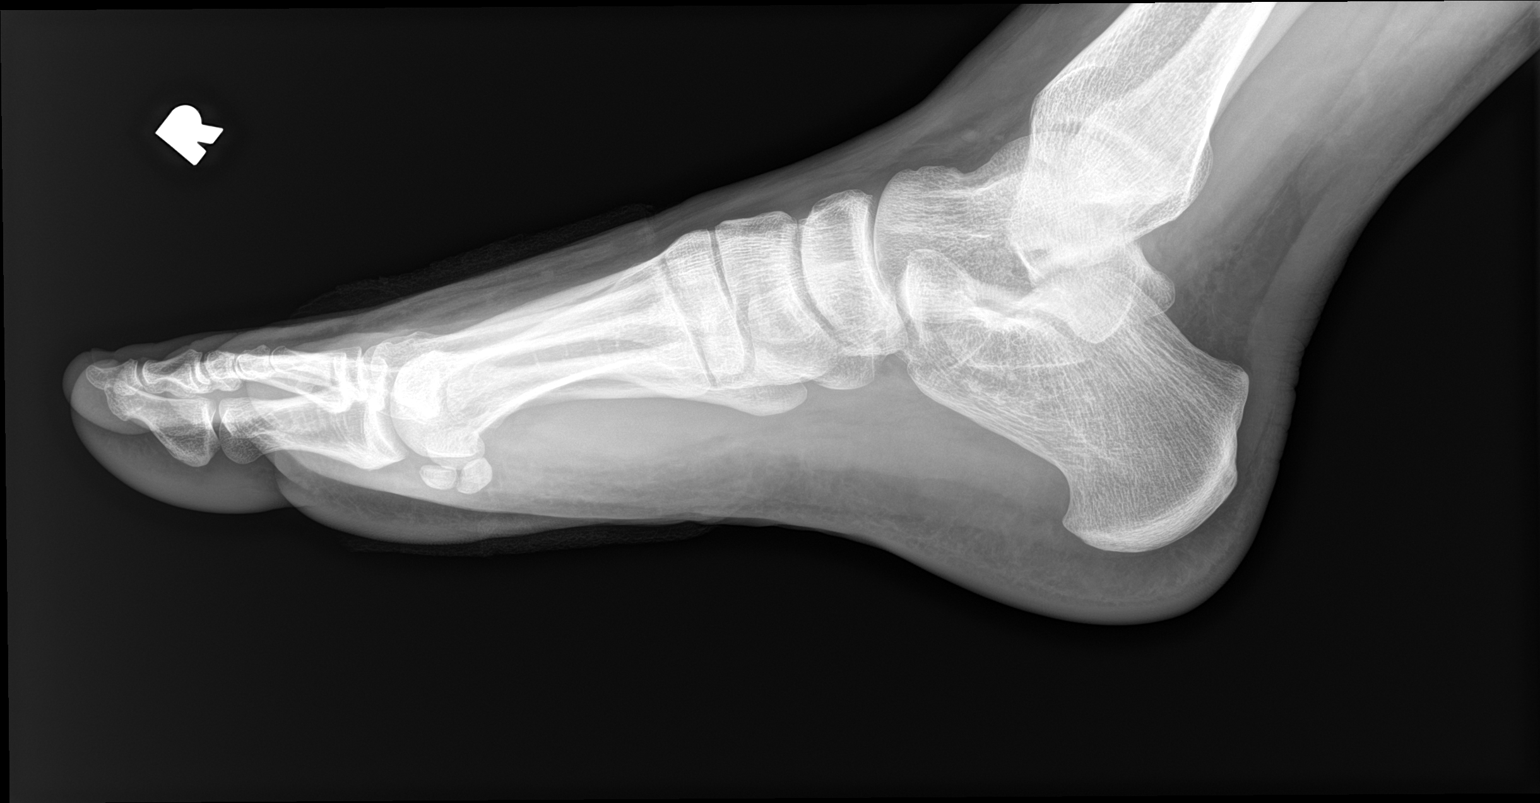

[3 of 3 positions shown; findings below may reference images not displayed]

FINDINGS: No acute fracture or dislocation. The bones are well mineralized. No
arthritic changes. The soft tissues are. No radiopaque foreign
object or soft tissue gas.
IMPRESSION: Negative.

## 2023-10-21 ENCOUNTER — Other Ambulatory Visit: Payer: Self-pay | Admitting: Family Medicine

## 2023-10-21 DIAGNOSIS — J4530 Mild persistent asthma, uncomplicated: Secondary | ICD-10-CM

## 2023-10-21 DIAGNOSIS — J309 Allergic rhinitis, unspecified: Secondary | ICD-10-CM

## 2023-10-25 HISTORY — PX: KNEE ARTHROSCOPY: SUR90

## 2023-11-12 ENCOUNTER — Encounter: Payer: Self-pay | Admitting: Cardiology

## 2023-11-12 ENCOUNTER — Ambulatory Visit: Payer: 59 | Attending: Cardiology | Admitting: Cardiology

## 2023-11-12 VITALS — BP 132/94 | HR 75 | Ht 72.0 in | Wt 292.8 lb

## 2023-11-12 DIAGNOSIS — I10 Essential (primary) hypertension: Secondary | ICD-10-CM

## 2023-11-12 DIAGNOSIS — E782 Mixed hyperlipidemia: Secondary | ICD-10-CM | POA: Diagnosis not present

## 2023-11-12 MED ORDER — VALSARTAN 160 MG PO TABS: 160.0000 mg | ORAL_TABLET | Freq: Every day | ORAL | 3 refills | Status: DC

## 2023-11-12 NOTE — Patient Instructions (Addendum)
Medication Instructions:  Your physician has recommended you make the following change in your medication: INCREASE: Valsartan 160 mg once daily  Please take your blood pressure twice daily for 2 weeks and send in a MyChart message. Please include heart rates.   HOW TO TAKE YOUR BLOOD PRESSURE: Rest 5 minutes before taking your blood pressure. Don't smoke or drink caffeinated beverages for at least 30 minutes before. Take your blood pressure before (not after) you eat. Sit comfortably with your back supported and both feet on the floor (don't cross your legs). Elevate your arm to heart level on a table or a desk. Use the proper sized cuff. It should fit smoothly and snugly around your bare upper arm. There should be enough room to slip a fingertip under the cuff. The bottom edge of the cuff should be 1 inch above the crease of the elbow. Ideally, take 3 measurements at one sitting and record the average.  *If you need a refill on your cardiac medications before your next appointment, please call your pharmacy*   Lab Work: None   Testing/Procedures: None   Follow-Up: At [facility], you and your health needs are our priority.  As part of our continuing mission to provide you with exceptional heart care, we have created designated Provider Care Teams.  These Care Teams include your primary Cardiologist (physician) and Advanced Practice Providers (APPs -  Physician Assistants and Nurse Practitioners) who all work together to provide you with the care you need, when you need it.  Your next appointment:   15 week(s)  Provider:   Thomasene Ripple, DO

## 2023-11-15 NOTE — Progress Notes (Signed)
Cardiology Office Note:    Date:  11/15/2023   ID:  Ronald Anderson, DOB Aug 13, 1986, MRN [id number]  PCP:  [name], MD  Cardiologist:  Thomasene Ripple, DO  Electrophysiologist:  None   Referring MD: [name], MD     History of Present Illness:    Ronald Anderson is a 37 y.o. male with a hx of Hyperlipidemia and hypertension who is here today for a follow up visit.   He reports recent knee surgery, reports persistent knee pain despite the procedure. He expresses hope for a partial knee replacement before the end of the year, as the pain is impacting his daily activities and work. He also reports that his blood pressure has been decreasing, but remains elevated. He is currently on hydrochlorothiazide and valsartan for hypertension management. He has a known allergy to penicillins and previously experienced leg swelling with amlodipine.  Past Medical History:  Diagnosis Date   Allergic rhinitis    Asthma    Dysthymia    GERD (gastroesophageal reflux disease)    Hypertension     Past Surgical History:  Procedure Laterality Date   WISDOM TOOTH EXTRACTION      Current Medications: Current Meds  Medication Sig   albuterol (VENTOLIN HFA) 108 (90 Base) MCG/ACT inhaler INHALE 2 PUFFS INTO THE LUNGS EVERY 6 (SIX) HOURS AS NEEDED FOR WHEEZING.   atorvastatin (LIPITOR) 10 MG tablet Take 1 tablet (10 mg total) by mouth at bedtime.   Budeson-Glycopyrrol-Formoterol (BREZTRI AEROSPHERE) 160-9-4.8 MCG/ACT AERO Inhale 2 puffs into the lungs in the morning and at bedtime.   cetirizine (ZYRTEC) 10 MG tablet Take 10 mg by mouth daily.   gabapentin (NEURONTIN) 300 MG capsule Take 300 mg by mouth at bedtime.   hydrochlorothiazide (HYDRODIURIL) 25 MG tablet Take 1 tablet (25 mg total) by mouth daily.   meloxicam (MOBIC) 7.5 MG tablet Take 1 tablet by mouth daily as needed.   Mepolizumab (NUCALA) 100 MG/ML SOAJ INJECT 100MG  SUBCUTANEOUSLY  EVERY 4 WEEKS   methocarbamol (ROBAXIN) 500 MG  tablet Take 500 mg by mouth every 6 (six) hours as needed.   montelukast (SINGULAIR) 10 MG tablet TAKE 1 TABLET(10 MG) BY MOUTH AT BEDTIME   Multiple Vitamin (MULTIVITAMIN) tablet Take 1 tablet by mouth daily.   NEEDLE, DISP, 18 G 18G X 1" MISC 1 Needle by Does not apply route every 14 (fourteen) days.   NEEDLE, DISP, 21 G 21G X 1" MISC 1 Needle by Does not apply route every 14 (fourteen) days.   omeprazole (PRILOSEC OTC) 20 MG tablet Take 1 tablet (20 mg total) by mouth 2 (two) times daily.   oxyCODONE (OXY IR/ROXICODONE) 5 MG immediate release tablet Take 5 mg by mouth every 4 (four) hours as needed.   PARoxetine (PAXIL) 20 MG tablet Take 1 tablet by mouth daily.   SYRINGE-NEEDLE, DISP, 3 ML 21G X 1" 3 ML MISC 1 each by Does not apply route every 14 (fourteen) days.   testosterone cypionate (DEPOTESTOSTERONE CYPIONATE) 200 MG/ML injection Inject 1 mL (200 mg total) into the muscle every 14 (fourteen) days.   traZODone (DESYREL) 100 MG tablet Take 100 mg by mouth at bedtime.   valsartan (DIOVAN) 160 MG tablet Take 1 tablet (160 mg total) by mouth daily.   [DISCONTINUED] valsartan (DIOVAN) 80 MG tablet Take 1 tablet (80 mg total) by mouth daily.     Allergies:   Amlodipine and Penicillins   Social History   Socioeconomic History  Marital status: Married    Spouse name: Not on file   Number of children: 1   Years of education: Not on file   Highest education level: Not on file  Occupational History   Occupation: Sports administrator: [employer]  Tobacco Use   Smoking status: Never   Smokeless tobacco: Never  Vaping Use   Vaping status: Never Used  Substance and Sexual Activity   Alcohol use: Yes    Comment: 6 beers per month   Drug use: No   Sexual activity: Not on file  Other Topics Concern   Not on file  Social History Narrative   Not on file   Social Determinants of Health   Financial Resource Strain: Not on file  Food Insecurity: Not on file  Transportation Needs:  Not on file  Physical Activity: Not on file  Stress: Not on file  Social Connections: Unknown (12/27/2022)   [facility]   Social Network    Social Network: Not on file     Family History: The patient's family history includes Heart attack (age of onset: 38) in his father; Heart disease in his mother; Hypertension in his father; Stroke in his father.  ROS:   Review of Systems  Constitution: Negative for decreased appetite, fever and weight gain.  HENT: Negative for congestion, ear discharge, hoarse voice and sore throat.   Eyes: Negative for discharge, redness, vision loss in right eye and visual halos.  Cardiovascular: Negative for chest pain, dyspnea on exertion, leg swelling, orthopnea and palpitations.  Respiratory: Negative for cough, hemoptysis, shortness of breath and snoring.   Endocrine: Negative for heat intolerance and polyphagia.  Hematologic/Lymphatic: Negative for bleeding problem. Does not bruise/bleed easily.  Skin: Negative for flushing, nail changes, rash and suspicious lesions.  Musculoskeletal: Negative for arthritis, joint pain, muscle cramps, myalgias, neck pain and stiffness.  Gastrointestinal: Negative for abdominal pain, bowel incontinence, diarrhea and excessive appetite.  Genitourinary: Negative for decreased libido, genital sores and incomplete emptying.  Neurological: Negative for brief paralysis, focal weakness, headaches and loss of balance.  Psychiatric/Behavioral: Negative for altered mental status, depression and suicidal ideas.  Allergic/Immunologic: Negative for HIV exposure and persistent infections.    EKGs/Labs/Other Studies Reviewed:    The following studies were reviewed today:   EKG:  None today   Recent Labs: 06/15/2023: Hemoglobin 15.4; Platelets 197 07/16/2023: ALT 33; BUN 25; Creatinine, Ser 1.09; Magnesium 2.1; Potassium 4.1; Sodium 140  Recent Lipid Panel    Component Value Date/Time   CHOL 215 (H)  07/16/2023 1216   TRIG 186 (H) 07/16/2023 1216   HDL 46 07/16/2023 1216   CHOLHDL 4.7 07/16/2023 1216   LDLCALC 136 (H) 07/16/2023 1216    Physical Exam:    VS:  BP (!) 132/94 (BP Location: Left Arm, Patient Position: Sitting, Cuff Size: Large)   Pulse 75   Ht 6' (1.829 m)   Wt 292 lb 12.8 oz (132.8 kg)   SpO2 94%   BMI 39.71 kg/m     Wt Readings from Last 3 Encounters:  11/12/23 292 lb 12.8 oz (132.8 kg)  08/09/23 241 lb 9.6 oz (109.6 kg)  07/11/23 234 lb (106.1 kg)     GEN: Well nourished, well developed in no acute distress HEENT: Normal NECK: No JVD; No carotid bruits LYMPHATICS: No lymphadenopathy CARDIAC: S1S2 noted,RRR, no murmurs, rubs, gallops RESPIRATORY:  Clear to auscultation without rales, wheezing or rhonchi  ABDOMEN: Soft, non-tender, non-distended, +bowel sounds,  no guarding. EXTREMITIES: No edema, No cyanosis, no clubbing MUSCULOSKELETAL:  No deformity  SKIN: Warm and dry NEUROLOGIC:  Alert and oriented x 3, non-focal PSYCHIATRIC:  Normal affect, good insight  ASSESSMENT:    1. Hypertension, unspecified type   2. Morbid obesity (HCC)   3. Mixed hyperlipidemia    PLAN:    Hypertension Blood pressure still elevated despite current regimen of Hydrochlorothiazide 25mg  and Valsartan 80mg  daily. No known allergies except Penicillin. No adverse effects from current antihypertensive medications. -Increase Valsartan to 160mg  daily. -Continue Hydrochlorothiazide 25mg  daily. -Check blood pressure twice daily and report readings via MyChart after two weeks.  Knee Pain Post-Surgery Persistent pain following recent knee surgery. Patient is considering partial knee replacement. -Continue current pain management plan. -Follow-up with orthopedic surgeon for further evaluation and potential partial knee replacement.  The patient is in agreement with the above plan. The patient left the office in stable condition.  The patient will follow up in   Medication  Adjustments/Labs and Tests Ordered: Current medicines are reviewed at length with the patient today.  Concerns regarding medicines are outlined above.  No orders of the defined types were placed in this encounter.  Meds ordered this encounter  Medications   valsartan (DIOVAN) 160 MG tablet    Sig: Take 1 tablet (160 mg total) by mouth daily.    Dispense:  90 tablet    Refill:  3    Patient Instructions  Medication Instructions:  Your physician has recommended you make the following change in your medication: INCREASE: Valsartan 160 mg once daily  Please take your blood pressure twice daily for 2 weeks and send in a MyChart message. Please include heart rates.   HOW TO TAKE YOUR BLOOD PRESSURE: Rest 5 minutes before taking your blood pressure. Don't smoke or drink caffeinated beverages for at least 30 minutes before. Take your blood pressure before (not after) you eat. Sit comfortably with your back supported and both feet on the floor (don't cross your legs). Elevate your arm to heart level on a table or a desk. Use the proper sized cuff. It should fit smoothly and snugly around your bare upper arm. There should be enough room to slip a fingertip under the cuff. The bottom edge of the cuff should be 1 inch above the crease of the elbow. Ideally, take 3 measurements at one sitting and record the average.  *If you need a refill on your cardiac medications before your next appointment, please call your pharmacy*   Lab Work: None   Testing/Procedures: None   Follow-Up: At [facility], you and your health needs are our priority.  As part of our continuing mission to provide you with exceptional heart care, we have created designated Provider Care Teams.  These Care Teams include your primary Cardiologist (physician) and Advanced Practice Providers (APPs -  Physician Assistants and Nurse Practitioners) who all work together to provide you with the care you need, when you  need it.  Your next appointment:   15 week(s)  Provider:   Thomasene Ripple, DO    Adopting a Healthy Lifestyle.  Know what a healthy weight is for you (roughly BMI <25) and aim to maintain this   Aim for 7+ servings of fruits and vegetables daily   65-80+ fluid ounces of water or unsweet tea for healthy kidneys   Limit to max 1 drink of alcohol per day; avoid smoking/tobacco   Limit animal fats in diet for cholesterol and heart health -  choose grass fed whenever available   Avoid highly processed foods, and foods high in saturated/trans fats   Aim for low stress - take time to unwind and care for your mental health   Aim for 150 min of moderate intensity exercise weekly for heart health, and weights twice weekly for bone health   Aim for 7-9 hours of sleep daily   When it comes to diets, agreement about the perfect plan isnt easy to find, even among the experts. Experts at the [facility] developed an idea known as the Healthy Eating Plate. Just imagine a plate divided into logical, healthy portions.   The emphasis is on diet quality:   Load up on vegetables and fruits - one-half of your plate: Aim for color and variety, and remember that potatoes dont count.   Go for whole grains - one-quarter of your plate: Whole wheat, barley, wheat berries, quinoa, oats, brown rice, and foods made with them. If you want pasta, go with whole wheat pasta.   Protein power - one-quarter of your plate: Fish, chicken, beans, and nuts are all healthy, versatile protein sources. Limit red meat.   The diet, however, does go beyond the plate, offering a few other suggestions.   Use healthy plant oils, such as olive, canola, soy, corn, sunflower and peanut. Check the labels, and avoid partially hydrogenated oil, which have unhealthy trans fats.   If youre thirsty, drink water. Coffee and tea are good in moderation, but skip sugary drinks and limit milk and dairy products to one  or two daily servings.   The type of carbohydrate in the diet is more important than the amount. Some sources of carbohydrates, such as vegetables, fruits, whole grains, and beans-are healthier than others.   Finally, stay active  Signed, Thomasene Ripple, DO  11/15/2023 8:11 PM    [facility]

## 2023-12-02 ENCOUNTER — Encounter: Payer: Self-pay | Admitting: Cardiology

## 2023-12-03 ENCOUNTER — Telehealth: Payer: Self-pay | Admitting: Pulmonary Disease

## 2023-12-03 DIAGNOSIS — J4540 Moderate persistent asthma, uncomplicated: Secondary | ICD-10-CM

## 2023-12-03 MED ORDER — BREZTRI AEROSPHERE 160-9-4.8 MCG/ACT IN AERO: 2.0000 | INHALATION_SPRAY | Freq: Two times a day (BID) | RESPIRATORY_TRACT | 11 refills | Status: AC

## 2023-12-03 NOTE — Telephone Encounter (Signed)
Breztri refill [id number] - [place], [place] - [address] AT [facility] OF [address] & [address]

## 2023-12-03 NOTE — Telephone Encounter (Signed)
Breztri refill, has been out for a week  [id number] - [place], [place] - [address] AT [facility] OF [address] & [address]

## 2023-12-03 NOTE — Telephone Encounter (Signed)
Called and spoke with pt about breztri refill he verbalized understanding nfn

## 2023-12-04 ENCOUNTER — Other Ambulatory Visit: Payer: Self-pay | Admitting: Nurse Practitioner

## 2023-12-05 ENCOUNTER — Other Ambulatory Visit: Payer: Self-pay | Admitting: Family Medicine

## 2023-12-05 MED ORDER — TESTOSTERONE CYPIONATE 200 MG/ML IM SOLN: 200.0000 mg | INTRAMUSCULAR | 0 refills | Status: DC

## 2023-12-13 ENCOUNTER — Telehealth: Payer: 59 | Admitting: Pulmonary Disease

## 2023-12-13 ENCOUNTER — Encounter: Payer: Self-pay | Admitting: Pulmonary Disease

## 2023-12-13 DIAGNOSIS — R0600 Dyspnea, unspecified: Secondary | ICD-10-CM | POA: Diagnosis not present

## 2023-12-13 DIAGNOSIS — J309 Allergic rhinitis, unspecified: Secondary | ICD-10-CM

## 2023-12-13 DIAGNOSIS — J45909 Unspecified asthma, uncomplicated: Secondary | ICD-10-CM | POA: Diagnosis not present

## 2023-12-13 DIAGNOSIS — J4530 Mild persistent asthma, uncomplicated: Secondary | ICD-10-CM

## 2023-12-13 MED ORDER — MONTELUKAST SODIUM 10 MG PO TABS: 10.0000 mg | ORAL_TABLET | Freq: Every day | ORAL | 4 refills | Status: AC

## 2023-12-13 MED ORDER — PREDNISONE 20 MG PO TABS: ORAL_TABLET | ORAL | 0 refills | Status: AC

## 2023-12-13 NOTE — Patient Instructions (Signed)
If nasal congestion not improving recommend taking zyrtec at night 1 tab and using Flonase 1 spray each nostril twice a day for 1 week and then decrease to once daily.   6 month follow up

## 2023-12-13 NOTE — Progress Notes (Signed)
@Patient  ID: Ronald Anderson, male    DOB: 1986/01/27, 37 y.o.   MRN: [id number]  No chief complaint on file.   Referring provider: [name], MD  HPI:   37 y.o. man whom we are seeing in follow up  for evaluation of dyspnea on exertion and asthma.  Multiple pharmacy notes reviewed.  He is doing okay.  Worse breathing this winter as weather colder. Using rescue daily often multiple times. Reports good adherence to breztri and Nucala.  HPI at initial visit:  Longstanding history of asthma.  Diagnosed many years ago.  Had a relatively stable on high-dose Advair discus.  However last several years has had progressive worsening of symptoms.  Does not like it there is working.  Discretion is dyspnea, cough with exertion.  Leads to wheezing.  Can use albuterol with gradual improvement but not 200%.  Overall worsening the last several years.  Recently tried air duo high-dose without improvement.  No inciting event or trigger to the symptoms.  No other relieving or exacerbating factors.  No time of day when things are better or worse.  No positions that make things better or worse. No seasonal or environmental factors that he can find to make things better or worse.  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  Dogs in the house.  Antihistamines greatly improved the symptoms.  Reviewed echocardiogram 12/2020 that is grossly normal.  Review chest x-ray 01/11/2020 on my review and interpretation reveals mild hyperinflation without infiltrate or abnormality otherwise.  Reviewed prior most recent chest x-ray 2011 on my review and interpretation reveals similar hyperinflation, more soft tissue recently compared to 2011.  reviewed recent lab work 10/22 with eosinophils 200, IgE 59.  PMH: Asthma, hypertension Surgical history: Wisdom tooth extraction Family history: CAD in mother, father with CAD, stroke, hypertension Social history: Never smoker, lives in  [place]  Questionaires / Pulmonary Flowsheets:   ACT:  Asthma Control Test ACT Total Score  10/23/2022  2:05 PM 22  12/04/2021  9:37 AM 17  10/17/2021  9:59 AM 9    MMRC:     No data to display          Epworth:      No data to display          Tests:   FENO:  No results found for: "NITRICOXIDE"  PFT:     No data to display          WALK:      No data to display          Imaging: Personally reviewed and as per EMR discussion of this note  Lab Results: Personally reviewed CBC    Component Value Date/Time   WBC 14.8 (H) 06/15/2023 1442   RBC 4.91 06/15/2023 1442   HGB 15.4 06/15/2023 1442   HGB 14.8 09/03/2022 1032   HCT 45.0 06/15/2023 1442   HCT 43.7 09/03/2022 1032   PLT 197 06/15/2023 1442   PLT 226 09/03/2022 1032   MCV 91.6 06/15/2023 1442   MCV 95 09/03/2022 1032   MCH 31.4 06/15/2023 1442   MCHC 34.2 06/15/2023 1442   RDW 13.5 06/15/2023 1442   RDW 11.9 09/03/2022 1032   LYMPHSABS 1.5 06/15/2023 1442   LYMPHSABS 1.1 09/03/2022 1032   MONOABS 0.8 06/15/2023 1442   EOSABS 0.0 06/15/2023 1442   EOSABS 0.0 09/03/2022 1032   BASOSABS 0.0 06/15/2023 1442   BASOSABS 0.0 09/03/2022 1032  BMET    Component Value Date/Time   NA 140 07/16/2023 1216   K 4.1 07/16/2023 1216   CL 100 07/16/2023 1216   CO2 25 07/16/2023 1216   GLUCOSE 95 07/16/2023 1216   GLUCOSE 126 (H) 06/15/2023 1442   BUN 25 (H) 07/16/2023 1216   CREATININE 1.09 07/16/2023 1216   CALCIUM 9.7 07/16/2023 1216   GFRNONAA >60 06/15/2023 1442   GFRAA >60 01/11/2020 0121    BNP No results found for: "BNP"  ProBNP No results found for: "PROBNP"  Specialty Problems       Pulmonary Problems   Asthma   Allergic rhinitis   OSA (obstructive sleep apnea)    Allergies  Allergen Reactions   Amlodipine Swelling   Penicillins Hives    Immunization History  Administered Date(s) Administered   Influenza Split 09/28/2014   Influenza,inj,Quad PF,6+  Mos 10/13/2017, 09/03/2022   Influenza-Unspecified 09/22/2014, 10/31/2016, 11/23/2018, 12/25/2019   Moderna SARS-COV2 Booster Vaccination 04/24/2021   Moderna Sars-Covid-2 Vaccination 07/11/2020, 08/08/2020   Tdap 12/24/2010, 04/24/2021    Past Medical History:  Diagnosis Date   Allergic rhinitis    Asthma    Dysthymia    GERD (gastroesophageal reflux disease)    Hypertension     Tobacco History: Social History   Tobacco Use  Smoking Status Never  Smokeless Tobacco Never   Counseling given: Not Answered   Continue to not smoke  Outpatient Encounter Medications as of 12/13/2023  Medication Sig   predniSONE (DELTASONE) 20 MG tablet Take 2 tablets (40 mg total) by mouth daily with breakfast for 5 days, THEN 1 tablet (20 mg total) daily with breakfast for 5 days.   albuterol (VENTOLIN HFA) 108 (90 Base) MCG/ACT inhaler INHALE 2 PUFFS INTO THE LUNGS EVERY 6 (SIX) HOURS AS NEEDED FOR WHEEZING.   atorvastatin (LIPITOR) 10 MG tablet Take 1 tablet (10 mg total) by mouth at bedtime.   Budeson-Glycopyrrol-Formoterol (BREZTRI AEROSPHERE) 160-9-4.8 MCG/ACT AERO Inhale 2 puffs into the lungs in the morning and at bedtime.   cetirizine (ZYRTEC) 10 MG tablet Take 10 mg by mouth daily.   gabapentin (NEURONTIN) 300 MG capsule Take 300 mg by mouth at bedtime.   hydrochlorothiazide (HYDRODIURIL) 25 MG tablet Take 1 tablet (25 mg total) by mouth daily.   Mepolizumab (NUCALA) 100 MG/ML SOAJ INJECT 100MG  SUBCUTANEOUSLY  EVERY 4 WEEKS   methocarbamol (ROBAXIN) 500 MG tablet Take 500 mg by mouth every 6 (six) hours as needed.   montelukast (SINGULAIR) 10 MG tablet Take 1 tablet (10 mg total) by mouth at bedtime.   Multiple Vitamin (MULTIVITAMIN) tablet Take 1 tablet by mouth daily.   NEEDLE, DISP, 18 G 18G X 1" MISC 1 Needle by Does not apply route every 14 (fourteen) days.   NEEDLE, DISP, 21 G 21G X 1" MISC 1 Needle by Does not apply route every 14 (fourteen) days.   omeprazole (PRILOSEC OTC) 20  MG tablet Take 1 tablet (20 mg total) by mouth 2 (two) times daily.   oxyCODONE (OXY IR/ROXICODONE) 5 MG immediate release tablet Take 5 mg by mouth every 4 (four) hours as needed.   PARoxetine (PAXIL) 20 MG tablet Take 1 tablet by mouth daily.   SYRINGE-NEEDLE, DISP, 3 ML 21G X 1" 3 ML MISC 1 each by Does not apply route every 14 (fourteen) days.   testosterone cypionate (DEPOTESTOSTERONE CYPIONATE) 200 MG/ML injection Inject 1 mL (200 mg total) into the muscle every 14 (fourteen) days.   traZODone (DESYREL) 100 MG tablet Take  100 mg by mouth at bedtime.   valsartan (DIOVAN) 160 MG tablet Take 1 tablet (160 mg total) by mouth daily.   [DISCONTINUED] gabapentin (NEURONTIN) 100 MG capsule Take 300 mg by mouth.   [DISCONTINUED] meloxicam (MOBIC) 7.5 MG tablet Take 1 tablet by mouth daily as needed.   [DISCONTINUED] montelukast (SINGULAIR) 10 MG tablet TAKE 1 TABLET(10 MG) BY MOUTH AT BEDTIME   [DISCONTINUED] predniSONE (DELTASONE) 5 MG tablet Take 5 mg by mouth. (Patient not taking: Reported on 11/12/2023)   No facility-administered encounter medications on file as of 12/13/2023.     Review of Systems  N/a Physical Exam  There were no vitals taken for this visit.  Wt Readings from Last 5 Encounters:  11/12/23 292 lb 12.8 oz (132.8 kg)  08/09/23 241 lb 9.6 oz (109.6 kg)  07/11/23 234 lb (106.1 kg)  07/10/23 235 lb (106.6 kg)  06/15/23 230 lb (104.3 kg)    BMI Readings from Last 5 Encounters:  11/12/23 39.71 kg/m  08/09/23 32.77 kg/m  07/11/23 31.74 kg/m  07/10/23 31.87 kg/m  06/15/23 31.19 kg/m     Physical Exam General: Well-appearing, no acute distress Eyes: EOMI, no icterus Neck: Supple Pulmonary: on room air, normal work of breathing Neuro: Normal gait, no weakness Psych: Normal mood, full affect   Assessment & Plan:   Dyspnea, cough: Suspect related to poorly controlled asthma.  Chest imaging clear with exception of hyper inflation.  High-dose ICS/LABA without  improvement.  Prior spirometry with fixed obstruction 2018.  Mild improvement with triple inhaled therapy via Breztri.  To continue.  Asthma: Historically, previous to Biologics, poorly controlled.  On Breztri.  IgE around 50.  Eos 200.  Started Nucala in early 2023.  Improved symptoms.   Worsening symptoms of dyspnea cough. Prednisone taper. Refilled montelukast   Return in about 6 months (around 06/12/2024) for f/u Dr. Judeth Horn.   Karren Burly, MD 12/13/2023  Virtual Visit via Video Note  I connected with Ronald Anderson on 12/13/23 at  1:00 PM EST by a video enabled telemedicine application and verified that I am speaking with the correct person using two identifiers.  Location: Patient: Home Provider: Office - [facility] - [address] [place], [address], [place], [place] [address]  I discussed the limitations of evaluation and management by telemedicine and the availability of in person appointments. The patient expressed understanding and agreed to proceed. I also discussed with the patient that there may be a patient responsible charge related to this service. The patient expressed understanding and agreed to proceed.  Patient consented to consult via telephone: Yes People present and their role in pt care: Pt

## 2023-12-24 ENCOUNTER — Ambulatory Visit (INDEPENDENT_AMBULATORY_CARE_PROVIDER_SITE_OTHER): Payer: 59 | Admitting: Family Medicine

## 2023-12-24 ENCOUNTER — Encounter: Payer: Self-pay | Admitting: Family Medicine

## 2023-12-24 VITALS — BP 136/92 | HR 93 | Wt 247.0 lb

## 2023-12-24 DIAGNOSIS — M1711 Unilateral primary osteoarthritis, right knee: Secondary | ICD-10-CM

## 2023-12-24 DIAGNOSIS — Z8249 Family history of ischemic heart disease and other diseases of the circulatory system: Secondary | ICD-10-CM

## 2023-12-24 DIAGNOSIS — R7303 Prediabetes: Secondary | ICD-10-CM | POA: Diagnosis not present

## 2023-12-24 DIAGNOSIS — E782 Mixed hyperlipidemia: Secondary | ICD-10-CM

## 2023-12-24 DIAGNOSIS — I7121 Aneurysm of the ascending aorta, without rupture: Secondary | ICD-10-CM | POA: Diagnosis not present

## 2023-12-24 LAB — POCT GLYCOSYLATED HEMOGLOBIN (HGB A1C): Hemoglobin A1C: 6.3 % — AB (ref 4.0–5.6)

## 2023-12-24 NOTE — Progress Notes (Signed)
   Subjective:    Patient ID: Ronald Anderson, male    DOB: 12/24/1986, 37 y.o.   MRN: [id number]  HPI He is here for a follow-up on prediabetes.  He has also had difficulty with right knee pain and has had surgery and apparently still having difficulty.  He is trying to get a brace for that but the insurance companies are apparently reluctant to authorize this.  He was recently placed on a statin drug.  Does have a positive family history for heart disease and has been on a statin for approximately 2 months.  As part of his workup an ascending aortic aneurysm was identified.   Review of Systems     Objective:    Physical Exam Alert and in no distress otherwise not examined.  Hemoglobin A1c is 6.3.       Assessment & Plan:  Arthritis of right knee  Aneurysm of ascending aorta without rupture (HCC)  Prediabetes - Plan: HgB A1c  Family history of heart disease in male family member before age 79  Mixed hyperlipidemia - Plan: Lipid panel I discussed the treatment of his right knee with him in regard to other modalities including using the brace, potential for further surgery potential for using PRP or other therapeutic interventions.  Recommend he discuss this further with Dr. Juliene Bailey. Discussed the prediabetes with him and the fact that if he does slip over the edge, using a GLP-1 would be ideal to help with diabetes, weight, cardiac issues.  He expressed understanding of all this.

## 2023-12-25 LAB — LIPID PANEL
Chol/HDL Ratio: 5.6 {ratio} — ABNORMAL HIGH (ref 0.0–5.0)
Cholesterol, Total: 167 mg/dL (ref 100–199)
HDL: 30 mg/dL — ABNORMAL LOW (ref >39)
LDL Chol Calc (NIH): 76 mg/dL (ref 0–99)
Triglycerides: 377 mg/dL — ABNORMAL HIGH (ref 0–149)
VLDL Cholesterol Cal: 61 mg/dL — ABNORMAL HIGH (ref 5–40)

## 2023-12-25 MED ORDER — ATORVASTATIN CALCIUM 20 MG PO TABS: 20.0000 mg | ORAL_TABLET | Freq: Every day | ORAL | 3 refills | Status: DC

## 2023-12-25 NOTE — Addendum Note (Signed)
 Addended by: [name] on: 12/25/2023 12:52 PM   Modules accepted: Orders

## 2024-01-13 ENCOUNTER — Encounter: Payer: Self-pay | Admitting: Family Medicine

## 2024-01-13 ENCOUNTER — Other Ambulatory Visit: Payer: Self-pay | Admitting: Pulmonary Disease

## 2024-01-13 ENCOUNTER — Telehealth: Payer: Self-pay

## 2024-01-13 ENCOUNTER — Other Ambulatory Visit: Payer: Self-pay

## 2024-01-13 DIAGNOSIS — E291 Testicular hypofunction: Secondary | ICD-10-CM

## 2024-01-13 DIAGNOSIS — J4540 Moderate persistent asthma, uncomplicated: Secondary | ICD-10-CM

## 2024-01-13 MED ORDER — NEEDLE (DISP) 21G X 1" MISC: 1.0000 | 1 refills | Status: AC

## 2024-01-13 MED ORDER — NEEDLE (DISP) 18G X 1" MISC: 1.0000 | 1 refills | Status: DC

## 2024-01-13 MED ORDER — SYRINGE/NEEDLE (DISP) 21G X 1" 3 ML MISC: 1.0000 | 1 refills | Status: AC

## 2024-01-13 NOTE — Telephone Encounter (Signed)
Refill sent for [facility] to [facility]: [phone]   Dose: 100mg  SQ every 28 days  Last OV: 12/13/2023 (video visit) Provider: Dr. Judeth Horn  Next OV: 6 months (not yet scheduled)  Chesley Mires, PharmD, MPH, BCPS Clinical Pharmacist (Rheumatology and Pulmonology)

## 2024-01-13 NOTE — Telephone Encounter (Signed)
Pt came in wanting to know when he is supposed to come get his testosterone checked. States he thought it would be at his 12/24/23 appt.   Also, testosterone can not be sent in as 10 mL bottle bc the preservatives in the vial begin to break down and there is increased risk of bacteria. I spoke with the pharmacy and they will give him 1 mL.

## 2024-01-15 NOTE — Telephone Encounter (Signed)
Called pt to schedule NV, no answer, left voicemail.

## 2024-01-17 NOTE — Telephone Encounter (Signed)
Pt scheduled for Tuesday. Please put in orders.

## 2024-01-21 ENCOUNTER — Other Ambulatory Visit: Payer: 59

## 2024-02-18 ENCOUNTER — Encounter: Payer: Self-pay | Admitting: Internal Medicine

## 2024-02-25 ENCOUNTER — Encounter (HOSPITAL_BASED_OUTPATIENT_CLINIC_OR_DEPARTMENT_OTHER): Payer: Self-pay

## 2024-02-25 ENCOUNTER — Encounter: Payer: Self-pay | Admitting: Cardiology

## 2024-02-25 ENCOUNTER — Ambulatory Visit: Payer: 59 | Attending: Cardiology | Admitting: Cardiology

## 2024-02-25 VITALS — BP 132/90 | HR 81 | Ht 72.0 in | Wt 252.8 lb

## 2024-02-25 DIAGNOSIS — I10 Essential (primary) hypertension: Secondary | ICD-10-CM | POA: Diagnosis not present

## 2024-02-25 DIAGNOSIS — Z79899 Other long term (current) drug therapy: Secondary | ICD-10-CM | POA: Diagnosis not present

## 2024-02-25 LAB — COMPREHENSIVE METABOLIC PANEL
ALT: 59 IU/L — ABNORMAL HIGH (ref 0–44)
AST: 36 IU/L (ref 0–40)
Albumin: 4.6 g/dL (ref 4.1–5.1)
Alkaline Phosphatase: 67 IU/L (ref 44–121)
BUN/Creatinine Ratio: 16 (ref 9–20)
BUN: 23 mg/dL — ABNORMAL HIGH (ref 6–20)
Bilirubin Total: 0.6 mg/dL (ref 0.0–1.2)
CO2: 25 mmol/L (ref 20–29)
Calcium: 9.7 mg/dL (ref 8.7–10.2)
Chloride: 97 mmol/L (ref 96–106)
Creatinine, Ser: 1.4 mg/dL — ABNORMAL HIGH (ref 0.76–1.27)
Globulin, Total: 2.6 g/dL (ref 1.5–4.5)
Glucose: 104 mg/dL — ABNORMAL HIGH (ref 70–99)
Potassium: 4.5 mmol/L (ref 3.5–5.2)
Sodium: 139 mmol/L (ref 134–144)
Total Protein: 7.2 g/dL (ref 6.0–8.5)
eGFR: 66 mL/min/{1.73_m2} (ref >59)

## 2024-02-25 LAB — MAGNESIUM: Magnesium: 2.1 mg/dL (ref 1.6–2.3)

## 2024-02-25 MED ORDER — VALSARTAN 160 MG PO TABS: 160.0000 mg | ORAL_TABLET | Freq: Two times a day (BID) | ORAL | 3 refills | Status: DC

## 2024-02-25 NOTE — Progress Notes (Signed)
 Cardiology Office Note:    Date:  02/25/2024   ID:  Ronald Anderson, DOB 1986/02/10, MRN [id number]  PCP:  [name], MD  Cardiologist:  Thomasene Ripple, DO  Electrophysiologist:  None   Referring MD: [name], MD     History of Present Illness:    Ronald Anderson is a 38 y.o. male with a hx of Hyperlipidemia and hypertension who is here today for a follow up visit.   At his visit in November 2024 at that time he was pending surgery.  He was hypertensive I increase his valsartan to 160 mg daily.  He is here today for follow-up visit.  He did have his surgery.  But has some difficulty starting time of recovery.  Right now he is experiencing low back pain concerned about herniated disc.  He is following with orthopedic.  From a blood pressure standpoint he tells me his blood pressure is in the 130s and 90s at home.  No chest pain denies any shortness of breath.   Past Medical History:  Diagnosis Date   Allergic rhinitis    Asthma    Dysthymia    GERD (gastroesophageal reflux disease)    Hypertension     Past Surgical History:  Procedure Laterality Date   WISDOM TOOTH EXTRACTION      Current Medications: Current Meds  Medication Sig   albuterol (VENTOLIN HFA) 108 (90 Base) MCG/ACT inhaler INHALE 2 PUFFS INTO THE LUNGS EVERY 6 (SIX) HOURS AS NEEDED FOR WHEEZING.   atorvastatin (LIPITOR) 20 MG tablet Take 1 tablet (20 mg total) by mouth daily.   Budeson-Glycopyrrol-Formoterol (BREZTRI AEROSPHERE) 160-9-4.8 MCG/ACT AERO Inhale 2 puffs into the lungs in the morning and at bedtime.   cetirizine (ZYRTEC) 10 MG tablet Take 10 mg by mouth daily.   gabapentin (NEURONTIN) 300 MG capsule Take 300 mg by mouth at bedtime.   hydrochlorothiazide (HYDRODIURIL) 25 MG tablet Take 1 tablet (25 mg total) by mouth daily.   methocarbamol (ROBAXIN) 500 MG tablet Take 500 mg by mouth every 6 (six) hours as needed.   montelukast (SINGULAIR) 10 MG tablet Take 1 tablet (10 mg total) by mouth at  bedtime.   Multiple Vitamin (MULTIVITAMIN) tablet Take 1 tablet by mouth daily.   NEEDLE, DISP, 18 G 18G X 1" MISC 1 Needle by Does not apply route every 14 (fourteen) days.   NEEDLE, DISP, 21 G 21G X 1" MISC 1 Needle by Does not apply route every 14 (fourteen) days.   NUCALA 100 MG/ML SOAJ INJECT 100MG  SUBCUTANEOUSLY  EVERY 4 WEEKS   PARoxetine (PAXIL) 20 MG tablet Take 1 tablet by mouth daily.   SYRINGE-NEEDLE, DISP, 3 ML 21G X 1" 3 ML MISC 1 each by Does not apply route every 14 (fourteen) days.   testosterone cypionate (DEPOTESTOSTERONE CYPIONATE) 200 MG/ML injection Inject 1 mL (200 mg total) into the muscle every 14 (fourteen) days.   traZODone (DESYREL) 100 MG tablet Take 100 mg by mouth at bedtime.   valsartan (DIOVAN) 160 MG tablet Take 1 tablet (160 mg total) by mouth daily.     Allergies:   Amlodipine and Penicillins   Social History   Socioeconomic History   Marital status: Married    Spouse name: Not on file   Number of children: 1   Years of education: Not on file   Highest education level: Associate degree: occupational, Scientist, product/process development, or vocational program  Occupational History   Occupation: Sports administrator: [employer]  Tobacco Use   Smoking status: Never   Smokeless tobacco: Never  Vaping Use   Vaping status: Never Used  Substance and Sexual Activity   Alcohol use: Yes    Comment: 6 beers per month   Drug use: No   Sexual activity: Not on file  Other Topics Concern   Not on file  Social History Narrative   Not on file   Social Drivers of Health   Financial Resource Strain: Low Risk  (12/22/2023)   Overall Financial Resource Strain (CARDIA)    Difficulty of Paying Living Expenses: Not hard at all  Food Insecurity: No Food Insecurity (12/22/2023)   Hunger Vital Sign    Worried About Running Out of Food in the Last Year: Never true    Ran Out of Food in the Last Year: Never true  Transportation Needs: No Transportation Needs (12/22/2023)   PRAPARE -  Administrator, Civil Service (Medical): No    Lack of Transportation (Non-Medical): No  Physical Activity: Sufficiently Active (12/22/2023)   Exercise Vital Sign    Days of Exercise per Week: 5 days    Minutes of Exercise per Session: 30 min  Stress: No Stress Concern Present (12/22/2023)   [employer] of Occupational Health - Occupational Stress Questionnaire    Feeling of Stress : Only a little  Social Connections: Moderately Integrated (12/22/2023)   Social Connection and Isolation Panel [NHANES]    Frequency of Communication with Friends and Family: Twice a week    Frequency of Social Gatherings with Friends and Family: Once a week    Attends Religious Services: More than 4 times per year    Active Member of [employer] or Organizations: No    Attends Engineer, structural: Not on file    Marital Status: Married     Family History: The patient's family history includes Heart attack (age of onset: 69) in his father; Heart disease in his mother; Hypertension in his father; Stroke in his father.  ROS:   Review of Systems  Constitution: Negative for decreased appetite, fever and weight gain.  HENT: Negative for congestion, ear discharge, hoarse voice and sore throat.   Eyes: Negative for discharge, redness, vision loss in right eye and visual halos.  Cardiovascular: Negative for chest pain, dyspnea on exertion, leg swelling, orthopnea and palpitations.  Respiratory: Negative for cough, hemoptysis, shortness of breath and snoring.   Endocrine: Negative for heat intolerance and polyphagia.  Hematologic/Lymphatic: Negative for bleeding problem. Does not bruise/bleed easily.  Skin: Negative for flushing, nail changes, rash and suspicious lesions.  Musculoskeletal: Negative for arthritis, joint pain, muscle cramps, myalgias, neck pain and stiffness.  Gastrointestinal: Negative for abdominal pain, bowel incontinence, diarrhea and excessive appetite.  Genitourinary:  Negative for decreased libido, genital sores and incomplete emptying.  Neurological: Negative for brief paralysis, focal weakness, headaches and loss of balance.  Psychiatric/Behavioral: Negative for altered mental status, depression and suicidal ideas.  Allergic/Immunologic: Negative for HIV exposure and persistent infections.    EKGs/Labs/Other Studies Reviewed:    The following studies were reviewed today:   EKG: Sinus rhythm, heart rate 81 bpm with left atrial enlargement.  Recent Labs: 06/15/2023: Hemoglobin 15.4; Platelets 197 07/16/2023: ALT 33; BUN 25; Creatinine, Ser 1.09; Magnesium 2.1; Potassium 4.1; Sodium 140  Recent Lipid Panel    Component Value Date/Time   CHOL 167 12/24/2023 1118   TRIG 377 (H) 12/24/2023 1118   HDL 30 (L) 12/24/2023 1118   CHOLHDL 5.6 (  H) 12/24/2023 1118   LDLCALC 76 12/24/2023 1118    Physical Exam:    VS:  BP (!) 138/90 (BP Location: Right Arm, Patient Position: Sitting, Cuff Size: Normal)   Pulse 81   Ht 6' (1.829 m)   Wt 252 lb 12.8 oz (114.7 kg)   SpO2 95%   BMI 34.29 kg/m     Wt Readings from Last 3 Encounters:  02/25/24 252 lb 12.8 oz (114.7 kg)  12/24/23 247 lb (112 kg)  11/12/23 292 lb 12.8 oz (132.8 kg)     GEN: Well nourished, well developed in no acute distress HEENT: Normal NECK: No JVD; No carotid bruits LYMPHATICS: No lymphadenopathy CARDIAC: S1S2 noted,RRR, no murmurs, rubs, gallops RESPIRATORY:  Clear to auscultation without rales, wheezing or rhonchi  ABDOMEN: Soft, non-tender, non-distended, +bowel sounds, no guarding. EXTREMITIES: No edema, No cyanosis, no clubbing MUSCULOSKELETAL:  No deformity  SKIN: Warm and dry NEUROLOGIC:  Alert and oriented x 3, non-focal PSYCHIATRIC:  Normal affect, good insight  ASSESSMENT:    1. Hypertension, unspecified type   2. Morbid obesity (HCC)    PLAN:    Hypertension -his blood pressure is still not quite controlled in the office.  Will increase his valsartan to 160 mg  twice a day.  Continue hydrochlorothiazide 25 mg daily.  Will get blood work today to look at kidney function.   The patient is in agreement with the above plan. The patient left the office in stable condition.  The patient will follow up in   Medication Adjustments/Labs and Tests Ordered: Current medicines are reviewed at length with the patient today.  Concerns regarding medicines are outlined above.  Orders Placed This Encounter  Procedures   EKG 12-Lead   No orders of the defined types were placed in this encounter.   There are no Patient Instructions on file for this visit.   Adopting a Healthy Lifestyle.  Know what a healthy weight is for you (roughly BMI <25) and aim to maintain this   Aim for 7+ servings of fruits and vegetables daily   65-80+ fluid ounces of water or unsweet tea for healthy kidneys   Limit to max 1 drink of alcohol per day; avoid smoking/tobacco   Limit animal fats in diet for cholesterol and heart health - choose grass fed whenever available   Avoid highly processed foods, and foods high in saturated/trans fats   Aim for low stress - take time to unwind and care for your mental health   Aim for 150 min of moderate intensity exercise weekly for heart health, and weights twice weekly for bone health   Aim for 7-9 hours of sleep daily   When it comes to diets, agreement about the perfect plan isnt easy to find, even among the experts. Experts at the [facility] [employer] developed an idea known as the Healthy Eating Plate. Just imagine a plate divided into logical, healthy portions.   The emphasis is on diet quality:   Load up on vegetables and fruits - one-half of your plate: Aim for color and variety, and remember that potatoes dont count.   Go for whole grains - one-quarter of your plate: Whole wheat, barley, wheat berries, quinoa, oats, brown rice, and foods made with them. If you want pasta, go with whole wheat pasta.   Protein  power - one-quarter of your plate: Fish, chicken, beans, and nuts are all healthy, versatile protein sources. Limit red meat.   The diet, however, does  go beyond the plate, offering a few other suggestions.   Use healthy plant oils, such as olive, canola, soy, corn, sunflower and peanut. Check the labels, and avoid partially hydrogenated oil, which have unhealthy trans fats.   If youre thirsty, drink water. Coffee and tea are good in moderation, but skip sugary drinks and limit milk and dairy products to one or two daily servings.   The type of carbohydrate in the diet is more important than the amount. Some sources of carbohydrates, such as vegetables, fruits, whole grains, and beans-are healthier than others.   Finally, stay active  Signed, Thomasene Ripple, DO  02/25/2024 9:17 AM    [facility]

## 2024-02-25 NOTE — Addendum Note (Signed)
 Addended by: Morrell Riddle on: 02/25/2024 09:45 AM   Modules accepted: Orders

## 2024-02-25 NOTE — Patient Instructions (Signed)
 Medication Instructions:  Increase: Valsartan to 160 mg Two Times Daily *If you need a refill on your cardiac medications before your next appointment, please call your pharmacy*   Lab Work: CMET and Magnesium today If you have labs (blood work) drawn today and your tests are completely normal, you will receive your results only by: MyChart Message (if you have MyChart) OR A paper copy in the mail If you have any lab test that is abnormal or we need to change your treatment, we will call you to review the results.   Testing/Procedures: None   Follow-Up: At [facility], you and your health needs are our priority.  As part of our continuing mission to provide you with exceptional heart care, we have created designated Provider Care Teams.  These Care Teams include your primary Cardiologist (physician) and Advanced Practice Providers (APPs -  Physician Assistants and Nurse Practitioners) who all work together to provide you with the care you need, when you need it.  We recommend signing up for the patient portal called "MyChart".  Sign up information is provided on this After Visit Summary.  MyChart is used to connect with patients for Virtual Visits (Telemedicine).  Patients are able to view lab/test results, encounter notes, upcoming appointments, etc.  Non-urgent messages can be sent to your provider as well.   To learn more about what you can do with MyChart, go to [email or web address].    Your next appointment:   6 week(s)- 8 weeks with Pharm D, then in 4 months with MD (Tobb)  Provider:   Thomasene Ripple, DO

## 2024-02-25 NOTE — Progress Notes (Signed)
 Dr. Servando Salina and Leavy Cella,   Amontae has been identified as a patient that may benefit from health coaching for healthy eating and physical activity. Please refer to [id number] or Care Navigation.  Renaee Munda, MS, ERHD, [facility]  Care Guide, Health & Wellness Coach [address] [place] [address] Telephone: [phone] Email: [email or web address]

## 2024-03-11 ENCOUNTER — Other Ambulatory Visit (HOSPITAL_COMMUNITY): Payer: Self-pay

## 2024-03-11 ENCOUNTER — Encounter: Payer: Self-pay | Admitting: Cardiology

## 2024-03-11 DIAGNOSIS — Z79899 Other long term (current) drug therapy: Secondary | ICD-10-CM

## 2024-03-11 MED ORDER — FUROSEMIDE 40 MG PO TABS: 40.0000 mg | ORAL_TABLET | Freq: Every day | ORAL | 0 refills | Status: DC

## 2024-03-11 MED ORDER — FUROSEMIDE 40 MG PO TABS
40.0000 mg | ORAL_TABLET | Freq: Every day | ORAL | 0 refills | Status: DC
  Filled 2024-03-11: qty 7, 7d supply, fill #0

## 2024-03-11 MED ORDER — POTASSIUM CHLORIDE CRYS ER 20 MEQ PO TBCR: 20.0000 meq | EXTENDED_RELEASE_TABLET | Freq: Every day | ORAL | 0 refills | Status: DC

## 2024-03-11 MED ORDER — POTASSIUM CHLORIDE CRYS ER 20 MEQ PO TBCR
20.0000 meq | EXTENDED_RELEASE_TABLET | Freq: Every day | ORAL | 0 refills | Status: DC
  Filled 2024-03-11: qty 7, 7d supply, fill #0

## 2024-03-11 NOTE — Telephone Encounter (Signed)
 Tobb, Kardie, DO to Cv Div Nl Triage  Reynolds Bowl, RN     03/11/24  4:39 PM Please try Lasix 40 mg once daily for the 7 days with KCL 20 meq daily     Prescriptions sent to pharmacy. Mychart message sent.

## 2024-03-12 NOTE — Telephone Encounter (Signed)
 Tobb, Kardie, DO to Cv Div Nl Triage  Reynolds Bowl, RN     03/11/24  6:56 PM  Please have him come in 1 week for blood work: CMP and mag    Noted  Orders placed and released

## 2024-03-13 ENCOUNTER — Other Ambulatory Visit: Payer: Self-pay

## 2024-03-21 LAB — COMPREHENSIVE METABOLIC PANEL WITH GFR
ALT: 55 IU/L — ABNORMAL HIGH (ref 0–44)
AST: 38 IU/L (ref 0–40)
Albumin: 4.9 g/dL (ref 4.1–5.1)
Alkaline Phosphatase: 80 IU/L (ref 44–121)
BUN/Creatinine Ratio: 17 (ref 9–20)
BUN: 26 mg/dL — ABNORMAL HIGH (ref 6–20)
Bilirubin Total: 0.8 mg/dL (ref 0.0–1.2)
CO2: 27 mmol/L (ref 20–29)
Calcium: 10.1 mg/dL (ref 8.7–10.2)
Chloride: 98 mmol/L (ref 96–106)
Creatinine, Ser: 1.54 mg/dL — ABNORMAL HIGH (ref 0.76–1.27)
Globulin, Total: 2.9 g/dL (ref 1.5–4.5)
Glucose: 69 mg/dL — ABNORMAL LOW (ref 70–99)
Potassium: 4.4 mmol/L (ref 3.5–5.2)
Sodium: 142 mmol/L (ref 134–144)
Total Protein: 7.8 g/dL (ref 6.0–8.5)
eGFR: 58 mL/min/{1.73_m2} — ABNORMAL LOW (ref >59)

## 2024-03-21 LAB — MAGNESIUM: Magnesium: 2.3 mg/dL (ref 1.6–2.3)

## 2024-03-26 ENCOUNTER — Encounter: Payer: Self-pay | Admitting: Cardiology

## 2024-03-26 ENCOUNTER — Other Ambulatory Visit: Payer: Self-pay

## 2024-03-26 DIAGNOSIS — R899 Unspecified abnormal finding in specimens from other organs, systems and tissues: Secondary | ICD-10-CM

## 2024-03-26 NOTE — Progress Notes (Signed)
 Lab orders placed for lab redraw.

## 2024-04-16 ENCOUNTER — Ambulatory Visit: Admitting: Pharmacist Clinician (PhC)/ Clinical Pharmacy Specialist

## 2024-04-23 ENCOUNTER — Encounter: Payer: Self-pay | Admitting: Cardiology

## 2024-05-22 ENCOUNTER — Other Ambulatory Visit (HOSPITAL_COMMUNITY): Payer: Self-pay

## 2024-05-22 ENCOUNTER — Ambulatory Visit: Attending: Cardiology | Admitting: Pharmacist

## 2024-05-22 ENCOUNTER — Encounter: Payer: Self-pay | Admitting: Pharmacist

## 2024-05-22 VITALS — BP 125/75 | HR 77

## 2024-05-22 DIAGNOSIS — I10 Essential (primary) hypertension: Secondary | ICD-10-CM

## 2024-05-22 MED ORDER — IRBESARTAN 300 MG PO TABS
300.0000 mg | ORAL_TABLET | Freq: Every day | ORAL | 3 refills | Status: AC
  Filled 2024-05-22: qty 90, 90d supply, fill #0
  Filled 2024-10-20: qty 90, 90d supply, fill #1

## 2024-05-22 MED ORDER — CHLORTHALIDONE 25 MG PO TABS
25.0000 mg | ORAL_TABLET | Freq: Every day | ORAL | 3 refills | Status: AC
  Filled 2024-05-22: qty 90, 90d supply, fill #0
  Filled 2024-10-20: qty 90, 90d supply, fill #1

## 2024-05-22 NOTE — Patient Instructions (Signed)
 Changes made by your pharmacist Nickola Baron, PharmD at today's visit:    Instructions/Changes  (what do you need to do) Your Notes  (what you did and when you did it)  Stop taking Diovan  and hydrochlorothiazide     Start taking irbesartan 300 mg daily and chlorthalidone 25 mg daily        Bring all of your meds, your BP cuff and your record of home blood pressures to your next appointment.    HOW TO TAKE YOUR BLOOD PRESSURE AT HOME  Rest 5 minutes before taking your blood pressure.  Don't smoke or drink caffeinated beverages for at least 30 minutes before. Take your blood pressure before (not after) you eat. Sit comfortably with your back supported and both feet on the floor (don't cross your legs). Elevate your arm to heart level on a table or a desk. Use the proper sized cuff. It should fit smoothly and snugly around your bare upper arm. There should be enough room to slip a fingertip under the cuff. The bottom edge of the cuff should be 1 inch above the crease of the elbow. Ideally, take 3 measurements at one sitting and record the average.  Important lifestyle changes to control high blood pressure  Intervention  Effect on the BP  Lose extra pounds and watch your waistline Weight loss is one of the most effective lifestyle changes for controlling blood pressure. If you're overweight or obese, losing even a small amount of weight can help reduce blood pressure. Blood pressure might go down by about 1 millimeter of mercury (mm Hg) with each kilogram (about 2.2 pounds) of weight lost.  Exercise regularly As a general goal, aim for at least 30 minutes of moderate physical activity every day. Regular physical activity can lower high blood pressure by about 5 to 8 mm Hg.  Eat a healthy diet Eating a diet rich in whole grains, fruits, vegetables, and low-fat dairy products and low in saturated fat and cholesterol. A healthy diet can lower high blood pressure by up to 11 mm Hg.  Reduce  salt (sodium) in your diet Even a small reduction of sodium in the diet can improve heart health and reduce high blood pressure by about 5 to 6 mm Hg.  Limit alcohol One drink equals 12 ounces of beer, 5 ounces of wine, or 1.5 ounces of 80-proof liquor.  Limiting alcohol to less than one drink a day for women or two drinks a day for men can help lower blood pressure by about 4 mm Hg.   If you have any questions or concerns please use My Chart to send questions or call the office at [phone]

## 2024-05-22 NOTE — Assessment & Plan Note (Signed)
 Assessment and plan:  In office BP was 127/75 heart rate 77 At home BP still in 130/90 range on previously validated BP monitor  Follows low salt diet and workplace involve lots of walking ( 8-9 miles/day)  Forget to take morning Diovan  dose due to early work schedule and take hydrochlorothiazide  25 mg at night with 160 mg Diovan ; prefers once a day med regimen to improve adherence  Will change valsartan  160 mg twice daily to irbesartan 300 mg once daily and change hydrochlorothiazide  to chorthalidone 25 mg daily for better diuretic response  Will get BMP checked in 2 weeks  F/u OV in 4 weeks

## 2024-05-22 NOTE — Progress Notes (Signed)
 Patient ID: Ronald Anderson                 DOB: [birth year]                      MRN: [id number]      HPI: Ronald Anderson is a 38 y.o. male referred by Dr. Emmette Harms to HTN clinic. PMH is significant for Hyperlipidemia and hypertension. Pt saw Dr. Emmette Harms in Nov 2024- Diovan  dose was increased from 80 mg daily to 160 mg daily and at last visit on 02/25/24 it was further up to 160 mg twice daily. Pt was referred to hypertension clinic.   The patient presented today for a hypertension clinic follow-up. He reports no significant change in his home blood pressure readings despite the increased dose of Diovan . When He was  prescribed a 7-day course of Lasix  for lower leg swelling and noted a weight loss of approximately 14 pounds over 2-3 days. He is now taking hydrochlorothiazide  25 mg at night with 2nd dose of Diovan . The patient admits he often forgets to take his morning dose of medication as he leaves for work early, around 5:00 AM, and therefore takes most of his medications at night. He prefers once-daily dosing over twice-daily regimens for better adherence. He follows a low-salt diet, avoids eating out, and his wife prepares meals at home without added salt. He walks 8-9 miles daily at work but does not have time for additional structured exercise.  Current HTN meds: Diovan  160 twice daily, hydrochlorothiazide  25 mg daily,  Previously tried: amlodipine  5 mg - swelling  BP goal: <120/80 given young age  Last CMP - CrCL 84 mL/min (adj BW)   Family History:  Relation Problem Comments  Mother (Alive) Heart disease     Father (Alive) Heart attack (Age: 6)   Hypertension   Stroke late 85's       Social History:  Alcohol: seldom  Smoking: none   Diet:  Low salt diet - home cooked meals  Exercise: walk 7-9 miles at work   Home BP readings: 130/90 range recalls from memory    Wt Readings from Last 3 Encounters:  02/25/24 252 lb 12.8 oz (114.7 kg)  12/24/23 247 lb (112 kg)  11/12/23 292 lb 12.8  oz (132.8 kg)   BP Readings from Last 3 Encounters:  05/22/24 125/75  02/25/24 (!) 132/90  12/24/23 (!) 136/92   Pulse Readings from Last 3 Encounters:  05/22/24 77  02/25/24 81  12/24/23 93    Renal function: CrCl cannot be calculated (Patient's most recent lab result is older than the maximum 21 days allowed.).  Past Medical History:  Diagnosis Date   Allergic rhinitis    Asthma    Dysthymia    GERD (gastroesophageal reflux disease)    Hypertension     Current Outpatient Medications on File Prior to Visit  Medication Sig Dispense Refill   albuterol  (VENTOLIN  HFA) 108 (90 Base) MCG/ACT inhaler INHALE 2 PUFFS INTO THE LUNGS EVERY 6 (SIX) HOURS AS NEEDED FOR WHEEZING. 2 each 11   atorvastatin  (LIPITOR) 20 MG tablet Take 1 tablet (20 mg total) by mouth daily. 90 tablet 3   Budeson-Glycopyrrol-Formoterol (BREZTRI  AEROSPHERE) 160-9-4.8 MCG/ACT AERO Inhale 2 puffs into the lungs in the morning and at bedtime. 16 g 11   cetirizine (ZYRTEC) 10 MG tablet Take 10 mg by mouth daily.     furosemide  (LASIX ) 40 MG tablet Take 1 tablet (40 mg total)  by mouth daily. 7 tablet 0   gabapentin (NEURONTIN) 300 MG capsule Take 300 mg by mouth at bedtime.     methocarbamol (ROBAXIN) 500 MG tablet Take 500 mg by mouth every 6 (six) hours as needed.     montelukast  (SINGULAIR ) 10 MG tablet Take 1 tablet (10 mg total) by mouth at bedtime. 90 tablet 4   Multiple Vitamin (MULTIVITAMIN) tablet Take 1 tablet by mouth daily.     NEEDLE, DISP, 18 G 18G X 1" MISC 1 Needle by Does not apply route every 14 (fourteen) days. 100 each 1   NEEDLE, DISP, 21 G 21G X 1" MISC 1 Needle by Does not apply route every 14 (fourteen) days. 100 each 1   NUCALA  100 MG/ML SOAJ INJECT 100MG  SUBCUTANEOUSLY  EVERY 4 WEEKS 1 mL 5   omeprazole  (PRILOSEC  OTC) 20 MG tablet Take 1 tablet (20 mg total) by mouth 2 (two) times daily. (Patient not taking: Reported on 02/25/2024) 60 tablet 11   oxyCODONE (OXY IR/ROXICODONE) 5 MG immediate  release tablet Take 5 mg by mouth every 4 (four) hours as needed. (Patient not taking: Reported on 02/25/2024)     PARoxetine (PAXIL) 20 MG tablet Take 1 tablet by mouth daily.     potassium chloride  SA (KLOR-CON  M) 20 MEQ tablet Take 1 tablet (20 mEq total) by mouth daily. 7 tablet 0   SYRINGE-NEEDLE, DISP, 3 ML 21G X 1" 3 ML MISC 1 each by Does not apply route every 14 (fourteen) days. 100 each 1   testosterone  cypionate (DEPOTESTOSTERONE CYPIONATE) 200 MG/ML injection Inject 1 mL (200 mg total) into the muscle every 14 (fourteen) days. 10 mL 0   traZODone (DESYREL) 100 MG tablet Take 100 mg by mouth at bedtime.     [DISCONTINUED] valsartan  (DIOVAN ) 160 MG tablet Take 1 tablet (160 mg total) by mouth daily. (Patient taking differently: Take 160 mg by mouth 2 (two) times daily.) 90 tablet 3   No current facility-administered medications on file prior to visit.    Allergies  Allergen Reactions   Amlodipine  Swelling   Penicillins Hives    Blood pressure 125/75, pulse 77, SpO2 94%.   Assessment/Plan:  1. Hypertension -  High blood pressure Assessment and plan:  In office BP was 127/75 heart rate 77 At home BP still in 130/90 range on previously validated BP monitor  Follows low salt diet and workplace involve lots of walking ( 8-9 miles/day)  Forget to take morning Diovan  dose due to early work schedule and take hydrochlorothiazide  25 mg at night with 160 mg Diovan ; prefers once a day med regimen to improve adherence  Will change valsartan  160 mg twice daily to irbesartan 300 mg once daily and change hydrochlorothiazide  to chorthalidone 25 mg daily for better diuretic response  Will get BMP checked in 2 weeks  F/u OV in 4 weeks       Thank you  Nickola Baron, Pharm.D [place] Jeralene Mom. [facility] [address], [place], [place] [address] Phone: [phone]; Fax: [phone]

## 2024-05-28 ENCOUNTER — Encounter: Payer: Self-pay | Admitting: Cardiology

## 2024-05-29 ENCOUNTER — Encounter: Payer: Self-pay | Admitting: Cardiology

## 2024-06-16 ENCOUNTER — Ambulatory Visit: Attending: Pharmacist | Admitting: Pharmacist

## 2024-06-16 ENCOUNTER — Encounter: Payer: Self-pay | Admitting: Pharmacist

## 2024-06-16 NOTE — Progress Notes (Deleted)
 Patient ID: Ronald Anderson                 DOB: 1986/11/19                      MRN: [id number]      HPI: Ronald Anderson is a 38 y.o. male referred by Dr. Sheena to HTN clinic. PMH is significant for Hyperlipidemia and hypertension. Pt saw Dr. Sheena in Nov 2024- Diovan  dose was increased from 80 mg daily to 160 mg daily and at last visit on 02/25/24 it was further up to 160 mg twice daily. Pt was referred to hypertension clinic.  At last visit with me on 05/30 Diovan  - twice daily dose ( was forgetting 2nd dose) hydrochlorothiazide  was changed to irbesartan  and chlorthalidone . Pt was advised to go for BMP lab in 2 weeks. Looks like BMP not done yet will get BMP checked today.   The patient presented today for a hypertension clinic follow-up. Ronald Anderson reports no significant change in his home blood pressure readings despite the increased dose of Diovan . When Ronald Anderson was  prescribed a 7-day course of Lasix  for lower leg swelling and noted a weight loss of approximately 14 pounds over 2-3 days. Ronald Anderson is now taking hydrochlorothiazide  25 mg at night with 2nd dose of Diovan . The patient admits Ronald Anderson often forgets to take his morning dose of medication as Ronald Anderson leaves for work early, around 5:00 AM, and therefore takes most of his medications at night. Ronald Anderson prefers once-daily dosing over twice-daily regimens for better adherence. Ronald Anderson follows a low-salt diet, avoids eating out, and his wife prepares meals at home without added salt. Ronald Anderson walks 8-9 miles daily at work but does not have time for additional structured exercise.  Current HTN meds: irbesartan  300 mg daily and chlorthalidone  25 mg daily  Previously tried: amlodipine  5 mg - swelling  BP goal: <120/80 given young age  Last CMP - CrCL 84 mL/min (adj BW)   Family History:  Relation Problem Comments  Mother (Alive) Heart disease     Father (Alive) Heart attack (Age: 25)   Hypertension   Stroke late 64's       Social History:  Alcohol: seldom  Smoking: none   Diet:  Low  salt diet - home cooked meals  Exercise: walk 7-9 miles at work   Home BP readings: 130/90 range recalls from memory    Wt Readings from Last 3 Encounters:  02/25/24 252 lb 12.8 oz (114.7 kg)  12/24/23 247 lb (112 kg)  11/12/23 292 lb 12.8 oz (132.8 kg)   BP Readings from Last 3 Encounters:  05/22/24 125/75  02/25/24 (!) 132/90  12/24/23 (!) 136/92   Pulse Readings from Last 3 Encounters:  05/22/24 77  02/25/24 81  12/24/23 93    Renal function: CrCl cannot be calculated (Patient's most recent lab result is older than the maximum 21 days allowed.).  Past Medical History:  Diagnosis Date   Allergic rhinitis    Asthma    Dysthymia    GERD (gastroesophageal reflux disease)    Hypertension     Current Outpatient Medications on File Prior to Visit  Medication Sig Dispense Refill   albuterol  (VENTOLIN  HFA) 108 (90 Base) MCG/ACT inhaler INHALE 2 PUFFS INTO THE LUNGS EVERY 6 (SIX) HOURS AS NEEDED FOR WHEEZING. 2 each 11   atorvastatin  (LIPITOR) 20 MG tablet Take 1 tablet (20 mg total) by mouth daily. 90 tablet 3   Budeson-Glycopyrrol-Formoterol (  BREZTRI  AEROSPHERE) 160-9-4.8 MCG/ACT AERO Inhale 2 puffs into the lungs in the morning and at bedtime. 16 g 11   cetirizine (ZYRTEC) 10 MG tablet Take 10 mg by mouth daily.     chlorthalidone  (HYGROTON ) 25 MG tablet Take 1 tablet (25 mg total) by mouth daily. 90 tablet 3   furosemide  (LASIX ) 40 MG tablet Take 1 tablet (40 mg total) by mouth daily. 7 tablet 0   gabapentin (NEURONTIN) 300 MG capsule Take 300 mg by mouth at bedtime.     irbesartan  (AVAPRO ) 300 MG tablet Take 1 tablet (300 mg total) by mouth daily. 90 tablet 3   methocarbamol (ROBAXIN) 500 MG tablet Take 500 mg by mouth every 6 (six) hours as needed.     montelukast  (SINGULAIR ) 10 MG tablet Take 1 tablet (10 mg total) by mouth at bedtime. 90 tablet 4   Multiple Vitamin (MULTIVITAMIN) tablet Take 1 tablet by mouth daily.     NEEDLE, DISP, 18 G 18G X 1 MISC 1 Needle by  Does not apply route every 14 (fourteen) days. 100 each 1   NEEDLE, DISP, 21 G 21G X 1 MISC 1 Needle by Does not apply route every 14 (fourteen) days. 100 each 1   NUCALA  100 MG/ML SOAJ INJECT 100MG  SUBCUTANEOUSLY  EVERY 4 WEEKS 1 mL 5   omeprazole  (PRILOSEC  OTC) 20 MG tablet Take 1 tablet (20 mg total) by mouth 2 (two) times daily. (Patient not taking: Reported on 02/25/2024) 60 tablet 11   oxyCODONE (OXY IR/ROXICODONE) 5 MG immediate release tablet Take 5 mg by mouth every 4 (four) hours as needed. (Patient not taking: Reported on 02/25/2024)     PARoxetine (PAXIL) 20 MG tablet Take 1 tablet by mouth daily.     potassium chloride  SA (KLOR-CON  M) 20 MEQ tablet Take 1 tablet (20 mEq total) by mouth daily. 7 tablet 0   SYRINGE-NEEDLE, DISP, 3 ML 21G X 1 3 ML MISC 1 each by Does not apply route every 14 (fourteen) days. 100 each 1   testosterone  cypionate (DEPOTESTOSTERONE CYPIONATE) 200 MG/ML injection Inject 1 mL (200 mg total) into the muscle every 14 (fourteen) days. 10 mL 0   traZODone (DESYREL) 100 MG tablet Take 100 mg by mouth at bedtime.     [DISCONTINUED] valsartan  (DIOVAN ) 160 MG tablet Take 1 tablet (160 mg total) by mouth daily. (Patient taking differently: Take 160 mg by mouth 2 (two) times daily.) 90 tablet 3   No current facility-administered medications on file prior to visit.    Allergies  Allergen Reactions   Amlodipine  Swelling   Penicillins Hives    There were no vitals taken for this visit.   Assessment/Plan:  1. Hypertension -  No problem-specific Assessment & Plan notes found for this encounter.       Thank you  Robbi Blanch, Pharm.D [facility] [address], [place], [place] [address] Phone: [phone]; Fax: [phone]

## 2024-06-23 NOTE — Telephone Encounter (Signed)
 Error

## 2024-09-01 ENCOUNTER — Other Ambulatory Visit: Payer: Self-pay | Admitting: Family Medicine

## 2024-09-14 ENCOUNTER — Other Ambulatory Visit: Payer: Self-pay | Admitting: Family Medicine

## 2024-09-14 NOTE — Telephone Encounter (Unsigned)
 Copied from CRM [id number]. Topic: Clinical - Medication Refill >> Sep 14, 2024  3:56 PM Tiffany B wrote: Medication: testosterone  cypionate (completely out) NEEDLE, DISP, 18 G 18G X 1 MISC   Has the patient contacted their pharmacy? Yes  (Agent: If yes, when and what did the pharmacy advise?) Contact PCP office and schedule a physical   This is the patient's preferred pharmacy:  [facility]  [address] [place], [place] [address] [phone]  Is this the correct pharmacy for this prescription? Yes   Has the prescription been filled recently?   Is the patient out of the medication?   Has the patient been seen for an appointment in the last year OR does the patient have an upcoming appointment? Yes   Can we respond through MyChart? Yes  Agent: Please be advised that Rx refills may take up to 3 business days. We ask that you follow-up with your pharmacy.

## 2024-09-15 NOTE — Telephone Encounter (Signed)
Scheduled appt for 9/25

## 2024-09-17 ENCOUNTER — Encounter: Payer: Self-pay | Admitting: Family Medicine

## 2024-09-17 ENCOUNTER — Ambulatory Visit (INDEPENDENT_AMBULATORY_CARE_PROVIDER_SITE_OTHER): Admitting: Family Medicine

## 2024-09-17 VITALS — BP 122/78 | HR 80 | Ht 71.5 in | Wt 234.8 lb

## 2024-09-17 DIAGNOSIS — K760 Fatty (change of) liver, not elsewhere classified: Secondary | ICD-10-CM

## 2024-09-17 DIAGNOSIS — E66811 Obesity, class 1: Secondary | ICD-10-CM

## 2024-09-17 DIAGNOSIS — G4733 Obstructive sleep apnea (adult) (pediatric): Secondary | ICD-10-CM | POA: Diagnosis not present

## 2024-09-17 DIAGNOSIS — Z131 Encounter for screening for diabetes mellitus: Secondary | ICD-10-CM | POA: Diagnosis not present

## 2024-09-17 DIAGNOSIS — E291 Testicular hypofunction: Secondary | ICD-10-CM | POA: Diagnosis not present

## 2024-09-17 DIAGNOSIS — E119 Type 2 diabetes mellitus without complications: Secondary | ICD-10-CM

## 2024-09-17 LAB — POCT GLYCOSYLATED HEMOGLOBIN (HGB A1C): Hemoglobin A1C: 6.5 % — AB (ref 4.0–5.6)

## 2024-09-17 MED ORDER — TESTOSTERONE 20.25 MG/ACT (1.62%) TD GEL: 2.0000 | Freq: Every day | TRANSDERMAL | 1 refills | Status: DC

## 2024-09-17 MED ORDER — TIRZEPATIDE 2.5 MG/0.5ML ~~LOC~~ SOAJ: 2.5000 mg | SUBCUTANEOUS | 1 refills | Status: DC

## 2024-09-17 NOTE — Progress Notes (Signed)
   Subjective:    Patient ID: Ronald Anderson, male    DOB: [birth year], 38 y.o.   MRN: [id number]  Discussed the use of AI scribe software for clinical note transcription with the patient, who gave verbal consent to proceed.  History of Present Illness   Ronald Anderson is a 38 year old male with low testosterone  who presents with persistent fatigue and low energy levels.  He experiences persistent fatigue and low energy levels despite being on testosterone  replacement therapy. He receives testosterone  injections every two weeks, which have only slightly improved his energy levels. His last injection was three weeks ago. He feels tired even after consuming caffeinated beverages. His testosterone  levels have not been checked mid-cycle. Initially, his testosterone  level was very low, around 118,  He has a history of hepatic steatosis, noted on imaging He has been diagnosed with obstructive sleep apnea and uses a CPAP machine, which is functioning well with a regular supply of necessary equipment. He receives electronic reports indicating normal usage.  He mentions a history of elevated A1c levels, which were previously monitored to assess the need for diabetes medication coverage. He drinks alcohol infrequently, about 'one beer a month'.  He has a BMI of 32.29, classifying him as obese, and wants to lose weight, particularly central abdominal fat, which he describes as 'all visceral'. He is interested in weight loss medications but notes that his insurance, [employer], does not currently cover GLP-1 medications for weight loss.           Review of Systems     Objective:    Physical Exam Alert and in no distress.  BMI is recorded. Hemoglobin A1c is now 6.5      Assessment & Plan:     Testicular hypofunction Minimal improvement with biweekly testosterone  injections.  - Switch to topical testosterone  gel applied to shoulders or sides post-shower. - Discuss precautions to prevent  skin-to-skin transfer to children. - Reassess testosterone  levels and treatment efficacy in one month.  He has an appoint with Dr. Vita  Fatty liver (hepatic steatosis) Hepatic steatosis increases risk for liver failure. Discussed Ozempic for steatosis and weight loss, though insurance coverage may be challenging. - Research further testing and treatment options for hepatic steatosis. -Obesity BMI 32, classified as obese. Discussed GLP-1 receptor agonists like Mounjaro  for weight loss, which may also improve obstructive sleep apnea and testosterone  levels. New onset diabetes -Discussed the diagnosis in terms of diet, exercise, footcare, eye care and risk of heart disease.  Also explained the fact that it would not qualify him to go on Mounjaro  which I think would be a good option for weight loss, OSA, hypogonadism and and potentially the steatosis. Discussed Mounjaro  in terms of possible side effects and increasing the dosing based on A1c and weight loss. Obstructive sleep apnea Uses CPAP with normal usage reports. Weight loss may improve sleep apnea and reduce CPAP need. - Continue CPAP therapy and monitor usage reports. - Consider weight loss strategies, Over 45 minutes spent discussing all these issues with him.  He does have an appointment set up for complete exam in approximately 1 month.  At that time can also retest the testosterone  level.

## 2024-09-18 ENCOUNTER — Other Ambulatory Visit (HOSPITAL_COMMUNITY): Payer: Self-pay

## 2024-09-18 ENCOUNTER — Ambulatory Visit: Payer: Self-pay | Admitting: Family Medicine

## 2024-09-18 ENCOUNTER — Other Ambulatory Visit: Payer: Self-pay

## 2024-09-18 ENCOUNTER — Telehealth: Payer: Self-pay

## 2024-09-18 DIAGNOSIS — R7989 Other specified abnormal findings of blood chemistry: Secondary | ICD-10-CM

## 2024-09-18 LAB — TESTOSTERONE: Testosterone: 181 ng/dL — ABNORMAL LOW (ref 264–916)

## 2024-09-18 MED ORDER — TESTOSTERONE CYPIONATE 200 MG/ML IM SOLN: 200.0000 mg | INTRAMUSCULAR | 0 refills | Status: DC

## 2024-09-18 MED ORDER — NEEDLE (DISP) 18G X 1" MISC: 1.0000 | 1 refills | Status: AC

## 2024-09-18 NOTE — Telephone Encounter (Signed)
 Pharmacy Patient Advocate Encounter   Received notification from CoverMyMeds that prior authorization for Mounjaro  2.5MG /0.5ML auto-injectors  is required/requested.   Insurance verification completed.   The patient is insured through [facility] .   Per test claim: PA required; PA submitted to above mentioned insurance via Latent Key/confirmation #/EOC [id number] Status is pending

## 2024-09-18 NOTE — Telephone Encounter (Signed)
 I called and spoke with Pt to set up lab for 10/6, Sent needles in for injection as well.

## 2024-09-21 ENCOUNTER — Other Ambulatory Visit (HOSPITAL_COMMUNITY): Payer: Self-pay

## 2024-09-21 NOTE — Telephone Encounter (Signed)
 Pharmacy Patient Advocate Encounter  Received notification from [employer] that Prior Authorization for Mounjaro  2.5MG /0.5ML auto-injectors has been APPROVED to 9.26.26. Ran test claim, Copay is $RTS, RX LAST FILLED ON TODAY 9.29.25. This test claim was processed through [facility]- copay amounts may vary at other pharmacies due to pharmacy/plan contracts, or as the patient moves through the different stages of their insurance plan.   PA #/Case ID/Reference #: [id number]

## 2024-09-28 ENCOUNTER — Other Ambulatory Visit

## 2024-10-07 ENCOUNTER — Other Ambulatory Visit: Payer: Self-pay | Admitting: Medical Genetics

## 2024-10-07 DIAGNOSIS — Z006 Encounter for examination for normal comparison and control in clinical research program: Secondary | ICD-10-CM

## 2024-10-16 ENCOUNTER — Ambulatory Visit: Admitting: Family Medicine

## 2024-10-16 VITALS — BP 126/82 | HR 88 | Ht 72.0 in | Wt 247.6 lb

## 2024-10-16 DIAGNOSIS — G4733 Obstructive sleep apnea (adult) (pediatric): Secondary | ICD-10-CM | POA: Diagnosis not present

## 2024-10-16 DIAGNOSIS — E291 Testicular hypofunction: Secondary | ICD-10-CM | POA: Diagnosis not present

## 2024-10-16 DIAGNOSIS — E119 Type 2 diabetes mellitus without complications: Secondary | ICD-10-CM | POA: Diagnosis not present

## 2024-10-16 DIAGNOSIS — Z136 Encounter for screening for cardiovascular disorders: Secondary | ICD-10-CM

## 2024-10-16 DIAGNOSIS — E66811 Obesity, class 1: Secondary | ICD-10-CM | POA: Diagnosis not present

## 2024-10-16 DIAGNOSIS — Z0000 Encounter for general adult medical examination without abnormal findings: Secondary | ICD-10-CM | POA: Diagnosis not present

## 2024-10-16 DIAGNOSIS — R7989 Other specified abnormal findings of blood chemistry: Secondary | ICD-10-CM

## 2024-10-16 MED ORDER — TIRZEPATIDE 5 MG/0.5ML ~~LOC~~ SOAJ: 5.0000 mg | SUBCUTANEOUS | 1 refills | Status: DC

## 2024-10-16 NOTE — Progress Notes (Signed)
 Name: Ronald Anderson   Date of Visit: 10/16/24   Date of last visit with me: Visit date not found   CHIEF COMPLAINT:  Chief Complaint  Patient presents with   Annual Exam    Cpe.        HPI:  Discussed the use of AI scribe software for clinical note transcription with the patient, who gave verbal consent to proceed.  History of Present Illness   Ronald Anderson is a 38 year old male with type 2 diabetes and GERD who presents for an annual physical exam and sinus congestion.  He has been experiencing sinus congestion for a couple of days, which worsens at night. He wakes up unable to breathe through his nose, although symptoms improve after a few hours. He takes Xyzal daily for allergies but avoids nasal sprays due to concerns about dependency, particularly with Afrin. He recalls using Flonase in the past but experienced headaches, possibly related to uncontrolled blood pressure at the time.  He is currently on Mounjaro  for type 2 diabetes, with minimal side effects except for some initial diarrhea and acid reflux. He notes that his gut feels slower, contributing to acid reflux, especially given his late dinner times due to his work schedule. He works long hours and often eats dinner late, which he believes contributes to his GERD symptoms.  He is on testosterone  replacement therapy, currently taking 200 mg every two weeks. Despite this, he feels fatigued and questions if the dosage is sufficient. He has a history of sleep apnea and uses a CPAP machine, though he often removes it unknowingly during sleep. He experiences 3-4 apneic events per hour, which affects his sleep quality. He also receives low heart rate notifications from his Apple Watch during sleep, with rates dropping to 35-40 BPM.  He works in a physically demanding environment, which he believes contributes to his fatigue and challenges with weight management.         OBJECTIVE:       10/16/2024    9:02 AM  Depression  screen PHQ 2/9  Decreased Interest 0  Down, Depressed, Hopeless 0  PHQ - 2 Score 0     BP Readings from Last 3 Encounters:  10/16/24 126/82  09/17/24 122/78  05/22/24 125/75    BP 126/82   Pulse 88   Ht 6' (1.829 m)   Wt 247 lb 9.6 oz (112.3 kg)   SpO2 97%   BMI 33.58 kg/m    Physical Exam          Physical Exam  ASSESSMENT/PLAN:   Assessment & Plan Annual physical exam  OSA (obstructive sleep apnea)  Obesity (BMI 30.0-34.9)  New onset type 2 diabetes mellitus (HCC)  Hypogonadism in male  Low testosterone  in male  Encounter for screening for cardiovascular disorders    Assessment and Plan    Type 2 diabetes mellitus Managed with Mounjaro . No significant side effects except transient diarrhea. No weight loss on current dose. - Increase Mounjaro  dose to 5 mg. - Instruct to send a MyChart message in three weeks to report tolerance of the new dose. - Plan to increase to 7.5 mg if tolerated. - Schedule follow-up in January to assess progress and consider further dose adjustment.  Obesity Management in conjunction with Type 2 diabetes treatment. Focus on weight loss through medication and dietary adjustments. - Encourage adherence to Mounjaro  and follow hunger cues to avoid unnecessary eating. - Emphasize the importance of hydration.  Obstructive sleep apnea  Suboptimal CPAP adherence. Reports removing CPAP during sleep and experiencing multiple apneic events per hour. Fatigue may be related to sleep apnea and weight. - Encourage weight loss as it may improve sleep apnea. - Discussed the potential need for CPAP adjustment or increased adherence.  Testosterone  deficiency Treated with 200 mg biweekly. Reports persistent fatigue, possibly related to sleep issues rather than testosterone  levels. - Order testosterone  level and related labs today. - Evaluate lab results to determine if dosage adjustment is needed. - Consider switching to weekly dosing if levels  are suboptimal and other lab parameters are normal.  Gastroesophageal reflux disease (GERD) Symptoms of acid reflux, possibly exacerbated by late meals and work schedule. - Advise to consider smaller or earlier meals to reduce reflux symptoms.  Allergic rhinitis Recent exacerbation. Managed with Xyzal. Previous adverse reaction to Flonase possibly related to uncontrolled blood pressure at the time. - Recommend starting Flonase nasal spray nightly throughout the winter.      Annual Physical - Denied COVID vaccine - Up to date on flu - Comprehensive annual physical exam completed today. Reviewed interval history, current medical issues, medications, allergies, and preventive care needs. Addressed all patient questions and concerns. Discussed lifestyle factors including diet, exercise, sleep, and stress management. Reviewed recommended age-appropriate screenings, labs, and vaccinations. Counseling provided on healthy habits and routine health maintenance. Follow-up as indicated based on findings and results.    Sandar Krinke A. Vita MD [facility]

## 2024-10-18 LAB — COMPREHENSIVE METABOLIC PANEL WITH GFR
ALT: 39 IU/L (ref 0–44)
AST: 20 IU/L (ref 0–40)
Albumin: 4.4 g/dL (ref 4.1–5.1)
Alkaline Phosphatase: 92 IU/L (ref 47–123)
BUN/Creatinine Ratio: 12 (ref 9–20)
BUN: 14 mg/dL (ref 6–20)
Bilirubin Total: 0.3 mg/dL (ref 0.0–1.2)
CO2: 22 mmol/L (ref 20–29)
Calcium: 9.2 mg/dL (ref 8.7–10.2)
Chloride: 104 mmol/L (ref 96–106)
Creatinine, Ser: 1.17 mg/dL (ref 0.76–1.27)
Globulin, Total: 2.5 g/dL (ref 1.5–4.5)
Glucose: 164 mg/dL — ABNORMAL HIGH (ref 70–99)
Potassium: 4.1 mmol/L (ref 3.5–5.2)
Sodium: 140 mmol/L (ref 134–144)
Total Protein: 6.9 g/dL (ref 6.0–8.5)
eGFR: 81 mL/min/1.73 (ref >59)

## 2024-10-18 LAB — CBC WITH DIFFERENTIAL/PLATELET
Basophils Absolute: 0.1 x10E3/uL (ref 0.0–0.2)
Basos: 1 %
EOS (ABSOLUTE): 0.3 x10E3/uL (ref 0.0–0.4)
Eos: 4 %
Hematocrit: 46.6 % (ref 37.5–51.0)
Hemoglobin: 15.8 g/dL (ref 13.0–17.7)
Immature Grans (Abs): 0.1 x10E3/uL (ref 0.0–0.1)
Immature Granulocytes: 1 %
Lymphocytes Absolute: 1.9 x10E3/uL (ref 0.7–3.1)
Lymphs: 24 %
MCH: 32.5 pg (ref 26.6–33.0)
MCHC: 33.9 g/dL (ref 31.5–35.7)
MCV: 96 fL (ref 79–97)
Monocytes Absolute: 0.5 x10E3/uL (ref 0.1–0.9)
Monocytes: 7 %
Neutrophils Absolute: 5.2 x10E3/uL (ref 1.4–7.0)
Neutrophils: 63 %
Platelets: 215 x10E3/uL (ref 150–450)
RBC: 4.86 x10E6/uL (ref 4.14–5.80)
RDW: 12.6 % (ref 11.6–15.4)
WBC: 8.1 x10E3/uL (ref 3.4–10.8)

## 2024-10-18 LAB — LIPID PANEL
Chol/HDL Ratio: 4.9 ratio (ref 0.0–5.0)
Cholesterol, Total: 167 mg/dL (ref 100–199)
HDL: 34 mg/dL — ABNORMAL LOW (ref >39)
LDL Chol Calc (NIH): 101 mg/dL — ABNORMAL HIGH (ref 0–99)
Triglycerides: 185 mg/dL — ABNORMAL HIGH (ref 0–149)
VLDL Cholesterol Cal: 32 mg/dL (ref 5–40)

## 2024-10-18 LAB — TESTOSTERONE,FREE AND TOTAL
Testosterone, Free: 3.3 pg/mL — AB (ref 8.7–25.1)
Testosterone: 129 ng/dL — ABNORMAL LOW (ref 264–916)

## 2024-10-19 ENCOUNTER — Other Ambulatory Visit

## 2024-10-20 ENCOUNTER — Telehealth: Payer: Self-pay | Admitting: *Deleted

## 2024-10-20 NOTE — Telephone Encounter (Signed)
 Patient was seen last week and was asking if you could send some diabetic diet info to him in MyChart, thanks.

## 2024-10-21 ENCOUNTER — Ambulatory Visit: Payer: Self-pay | Admitting: Family Medicine

## 2024-10-21 ENCOUNTER — Other Ambulatory Visit (HOSPITAL_COMMUNITY): Payer: Self-pay

## 2024-10-21 ENCOUNTER — Other Ambulatory Visit: Payer: Self-pay | Admitting: Family Medicine

## 2024-10-21 ENCOUNTER — Other Ambulatory Visit: Payer: Self-pay

## 2024-10-21 MED ORDER — TESTOSTERONE CYPIONATE 200 MG/ML IM SOLN: 200.0000 mg | INTRAMUSCULAR | 0 refills | Status: DC

## 2024-11-13 ENCOUNTER — Encounter: Admitting: Family Medicine

## 2024-11-16 ENCOUNTER — Encounter: Payer: Self-pay | Admitting: Family Medicine

## 2024-11-17 MED ORDER — TIRZEPATIDE 7.5 MG/0.5ML ~~LOC~~ SOAJ: 7.5000 mg | SUBCUTANEOUS | 1 refills | Status: DC

## 2024-12-10 ENCOUNTER — Telehealth (HOSPITAL_BASED_OUTPATIENT_CLINIC_OR_DEPARTMENT_OTHER): Payer: Self-pay | Admitting: *Deleted

## 2024-12-10 ENCOUNTER — Telehealth: Payer: Self-pay

## 2024-12-10 NOTE — Telephone Encounter (Signed)
 Fax came in with Surgical Clearance Form, will place in brown folder.

## 2024-12-10 NOTE — Telephone Encounter (Signed)
° °  Pre-operative Risk Assessment    Patient Name: Ronald Anderson  DOB: [birth year] MRN: [id number]   Date of last office visit: 02/25/24 DR. TOBB Date of next office visit: NONE   Request for Surgical Clearance    Procedure:  LUMBAR DECOMPRESSION  Date of Surgery:  Clearance TBD                                Surgeon:  DR. REYES BILLING Surgeon's Group or Practice Name:  [facility] Phone number:  [phone] [facility] Fax number:  [phone]   Type of Clearance Requested:   - Medical    Type of Anesthesia:  General    Additional requests/questions:    Bonney Niels Jest   12/10/2024, 5:02 PM

## 2024-12-11 ENCOUNTER — Telehealth: Payer: Self-pay

## 2024-12-11 NOTE — Telephone Encounter (Signed)
 Preop tele appt now scheduled, med rec and consent done.

## 2024-12-11 NOTE — Telephone Encounter (Signed)
"  °  Patient Consent for Virtual Visit        Ronald Anderson has provided verbal consent on 12/11/2024 for a virtual visit (video or telephone).   CONSENT FOR VIRTUAL VISIT FOR:  Ronald Anderson  By participating in this virtual visit I agree to the following:  I hereby voluntarily request, consent and authorize [facility] and its employed or contracted physicians, physician assistants, nurse practitioners or other licensed health care professionals (the Practitioner), to provide me with telemedicine health care services (the Services) as deemed necessary by the treating Practitioner. I acknowledge and consent to receive the Services by the Practitioner via telemedicine. I understand that the telemedicine visit will involve communicating with the Practitioner through live audiovisual communication technology and the disclosure of certain medical information by electronic transmission. I acknowledge that I have been given the opportunity to request an in-person assessment or other available alternative prior to the telemedicine visit and am voluntarily participating in the telemedicine visit.  I understand that I have the right to withhold or withdraw my consent to the use of telemedicine in the course of my care at any time, without affecting my right to future care or treatment, and that the Practitioner or I may terminate the telemedicine visit at any time. I understand that I have the right to inspect all information obtained and/or recorded in the course of the telemedicine visit and may receive copies of available information for a reasonable fee.  I understand that some of the potential risks of receiving the Services via telemedicine include:  Delay or interruption in medical evaluation due to technological equipment failure or disruption; Information transmitted may not be sufficient (e.g. poor resolution of images) to allow for appropriate medical decision making by the Practitioner;  and/or  In rare instances, security protocols could fail, causing a breach of personal health information.  Furthermore, I acknowledge that it is my responsibility to provide information about my medical history, conditions and care that is complete and accurate to the best of my ability. I acknowledge that Practitioner's advice, recommendations, and/or decision may be based on factors not within their control, such as incomplete or inaccurate data provided by me or distortions of diagnostic images or specimens that may result from electronic transmissions. I understand that the practice of medicine is not an exact science and that Practitioner makes no warranties or guarantees regarding treatment outcomes. I acknowledge that a copy of this consent can be made available to me via my patient portal [facility] MyChart), or I can request a printed copy by calling the office of [facility].    I understand that my insurance will be billed for this visit.   I have read or had this consent read to me. I understand the contents of this consent, which adequately explains the benefits and risks of the Services being provided via telemedicine.  I have been provided ample opportunity to ask questions regarding this consent and the Services and have had my questions answered to my satisfaction. I give my informed consent for the services to be provided through the use of telemedicine in my medical care    "

## 2024-12-11 NOTE — Telephone Encounter (Signed)
" ° °  Name: Ronald Anderson  DOB: 18-Jun-1986  MRN: [id number]  Primary Cardiologist: Kardie Tobb, DO  Chart reviewed as part of pre-operative protocol coverage. Because of Karter Hellmer Matousek's past medical history and time since last visit, he will require a follow-up telephone visit in order to better assess preoperative cardiovascular risk.  Pre-op covering staff: - Please schedule appointment and call patient to inform them. If patient already had an upcoming appointment within acceptable timeframe, please add pre-op clearance to the appointment notes so provider is aware. - Please contact requesting surgeon's office via preferred method (i.e, phone, fax) to inform them of need for appointment prior to surgery.  No medications indicated as needing held.    Orren LOISE Fabry, PA-C  12/11/2024, 11:10 AM   "

## 2024-12-15 ENCOUNTER — Telehealth: Payer: Self-pay

## 2024-12-15 ENCOUNTER — Ambulatory Visit

## 2024-12-15 DIAGNOSIS — Z01810 Encounter for preprocedural cardiovascular examination: Secondary | ICD-10-CM | POA: Diagnosis not present

## 2024-12-15 NOTE — Telephone Encounter (Signed)
 Copied from CRM [id number]. Topic: General - Other >> Dec 15, 2024 10:27 AM Leonette SQUIBB wrote: Reason for CRM: Patient called wanting to know if the office received a pre surgery clearance form for the provider to complete.  The patient is having surgery and is hoping to get it scheduled before the end of the year.  He said he had a CPe a couple moths ago

## 2024-12-15 NOTE — Telephone Encounter (Signed)
 The brown folder is up front, when he comes in for appt January 5th, the front will put surgical clearance form in his folder for us  to bring back.

## 2024-12-15 NOTE — Telephone Encounter (Signed)
 Copied from CRM #[id number]. Topic: Clinical - Medication Question >> Dec 15, 2024 10:32 AM Leonette SQUIBB wrote: Reason for CRM: Pt called saying he is ready for his next dose of Mounjaro  and thinking it needs to be raise in the dosing.

## 2024-12-15 NOTE — Addendum Note (Signed)
 Addended by: Lelah Rennaker on: 12/15/2024 11:55 AM   Modules accepted: Orders

## 2024-12-15 NOTE — Progress Notes (Signed)
 "   Virtual Visit via Telephone Note   Because of ASAHEL RISDEN co-morbid illnesses, he is at least at moderate risk for complications without adequate follow up.  This format is felt to be most appropriate for this patient at this time.  Due to technical limitations with video connection (technology), today's appointment will be conducted as an audio only telehealth visit, and ANTAWN SISON verbally agreed to proceed in this manner.   All issues noted in this document were discussed and addressed.  No physical exam could be performed with this format.  Evaluation Performed:  Preoperative cardiovascular risk assessment _____________   Date:  12/15/2024   Patient ID:  [name], DOB 26-May-1986, MRN [id number] Patient Location:  Home Provider location:   Office  Primary Care Provider:  Vita Morrow, MD Primary Cardiologist:  Kardie Tobb, DO  Chief Complaint / Patient Profile   38 y.o. y/o male with a h/o HTN, HLD, ascending aortic aneurysm, CAC of 0 who is pending lumbar decompression on TBD and presents today for telephonic preoperative cardiovascular risk assessment.  History of Present Illness    THURMON MIZELL is a 38 y.o. male who presents via audio/video conferencing for a telehealth visit today.  Pt was last seen in cardiology clinic on 02/25/2024 by Dr. Sheena.  At that time ELEANOR DIMICHELE was doing well.  The patient is now pending procedure as outlined above. Since his last visit, He denies chest pain, palpitations, dyspnea, orthopnea, n, v,  dark/tarry/bloody stools, hematuria, dizziness, syncope, edema, weight gain.   Past Medical History    Past Medical History:  Diagnosis Date   Allergic rhinitis    Asthma    Dysthymia    GERD (gastroesophageal reflux disease)    Hypertension    Past Surgical History:  Procedure Laterality Date   WISDOM TOOTH EXTRACTION      Allergies  Allergies[1]  Home Medications    Prior to Admission medications  Medication Sig Start Date  End Date Taking? Authorizing Provider  albuterol  (VENTOLIN  HFA) 108 (90 Base) MCG/ACT inhaler INHALE 2 PUFFS INTO THE LUNGS EVERY 6 (SIX) HOURS AS NEEDED FOR WHEEZING. Patient taking differently: Inhale 2 puffs into the lungs as needed for wheezing or shortness of breath. INHALE 2 PUFFS INTO THE LUNGS EVERY 6 (SIX) HOURS AS NEEDED FOR WHEEZING. 07/10/23   Hunsucker, Donnice JONELLE, MD  atorvastatin  (LIPITOR) 20 MG tablet Take 1 tablet (20 mg total) by mouth daily. 12/25/23 12/24/24  Lalonde, John C, MD  Budeson-Glycopyrrol-Formoterol (BREZTRI  AEROSPHERE) 160-9-4.8 MCG/ACT AERO Inhale 2 puffs into the lungs in the morning and at bedtime. 12/03/23   Hunsucker, Donnice JONELLE, MD  cetirizine (ZYRTEC) 10 MG tablet Take 10 mg by mouth daily.    [provider]  chlorthalidone  (HYGROTON ) 25 MG tablet Take 1 tablet (25 mg total) by mouth daily. 05/22/24 01/20/25  Tobb, Kardie, DO  gabapentin (NEURONTIN) 300 MG capsule Take 300 mg by mouth at bedtime. 08/28/23   [provider]  irbesartan  (AVAPRO ) 300 MG tablet Take 1 tablet (300 mg total) by mouth daily. 05/22/24   Tobb, Kardie, DO  montelukast  (SINGULAIR ) 10 MG tablet Take 1 tablet (10 mg total) by mouth at bedtime. 12/13/23   Hunsucker, Donnice JONELLE, MD  Multiple Vitamin (MULTIVITAMIN) tablet Take 1 tablet by mouth daily.    [provider]  NEEDLE, DISP, 18 G 18G X 1 MISC 1 Needle by Does not apply route every 14 (fourteen) days. 09/18/24   Joyce,  Norleen BROCKS, MD  NEEDLE, DISP, 21 G 21G X 1 MISC 1 Needle by Does not apply route every 14 (fourteen) days. 01/13/24   Joyce Norleen BROCKS, MD  omeprazole  (PRILOSEC  OTC) 20 MG tablet Take 1 tablet (20 mg total) by mouth 2 (two) times daily. Patient taking differently: Take 20 mg by mouth as needed. 11/03/13   Lalonde, John C, MD  PARoxetine (PAXIL) 20 MG tablet Take 1 tablet by mouth daily.    [provider]  SYRINGE-NEEDLE, DISP, 3 ML 21G X 1 3 ML MISC 1 each by Does not apply route every 14  (fourteen) days. 01/13/24   Lalonde, John C, MD  testosterone  cypionate (DEPOTESTOSTERONE CYPIONATE) 200 MG/ML injection Inject 1 mL (200 mg total) into the muscle once a week. 10/21/24   Jha, Panav, MD  tirzepatide  (MOUNJARO ) 5 MG/0.5ML Pen Inject 5 mg into the skin once a week. 10/16/24   Jha, Panav, MD  tirzepatide  (MOUNJARO ) 7.5 MG/0.5ML Pen Inject 7.5 mg into the skin once a week. 11/17/24   Jha, Panav, MD  traZODone (DESYREL) 100 MG tablet Take 100 mg by mouth at bedtime. 08/28/23   [provider]  valsartan  (DIOVAN ) 160 MG tablet Take 1 tablet (160 mg total) by mouth daily. Patient taking differently: Take 160 mg by mouth 2 (two) times daily. 11/12/23   Tobb, Kardie, DO    Physical Exam    Vital Signs:  PERSHING SKIDMORE does not have vital signs available for review today.  Given telephonic nature of communication, physical exam is limited. AAOx3. NAD. Normal affect.  Speech and respirations are unlabored.  Accessory Clinical Findings    None  Assessment & Plan    1.  Preoperative Cardiovascular Risk Assessment: According to the Revised Cardiac Risk Index (RCRI), his Perioperative Risk of Major Cardiac Event is (%): 0.4  His Functional Capacity in METs is: 5.07 according to the Duke Activity Status Index (DASI). Therefore, based on ACC/AHA guidelines, patient would be at acceptable risk for the planned procedure without further cardiovascular testing. I will route this recommendation to the requesting party via Epic fax function.  The patient was advised that if he develops new symptoms prior to surgery to contact our office to arrange for a follow-up visit, and he verbalized understanding.   A copy of this note will be routed to requesting surgeon.  Time:   Today, I have spent 10 minutes with the patient with telehealth technology discussing medical history, symptoms, and management plan.     Danella Philson E Kaimana Lurz, NP  12/15/2024, 7:44 AM     [1]   Allergies Allergen Reactions   Amlodipine  Swelling   Penicillins Hives   "

## 2024-12-18 ENCOUNTER — Encounter: Payer: Self-pay | Admitting: Family Medicine

## 2024-12-21 MED ORDER — TIRZEPATIDE 10 MG/0.5ML ~~LOC~~ SOAJ: 10.0000 mg | SUBCUTANEOUS | 2 refills | Status: AC

## 2024-12-22 NOTE — Telephone Encounter (Signed)
 Faxed surgical clearance form to [facility]

## 2024-12-28 ENCOUNTER — Ambulatory Visit: Payer: Self-pay | Admitting: Orthopedic Surgery

## 2024-12-28 ENCOUNTER — Ambulatory Visit: Payer: Self-pay | Admitting: Family Medicine

## 2024-12-28 DIAGNOSIS — M48062 Spinal stenosis, lumbar region with neurogenic claudication: Secondary | ICD-10-CM

## 2024-12-30 ENCOUNTER — Ambulatory Visit: Admitting: Family Medicine

## 2024-12-30 VITALS — BP 130/84 | HR 78 | Wt 241.2 lb

## 2024-12-30 DIAGNOSIS — M79642 Pain in left hand: Secondary | ICD-10-CM

## 2024-12-30 DIAGNOSIS — W57XXXA Bitten or stung by nonvenomous insect and other nonvenomous arthropods, initial encounter: Secondary | ICD-10-CM

## 2024-12-30 DIAGNOSIS — S40862A Insect bite (nonvenomous) of left upper arm, initial encounter: Secondary | ICD-10-CM | POA: Diagnosis not present

## 2024-12-30 DIAGNOSIS — E291 Testicular hypofunction: Secondary | ICD-10-CM

## 2024-12-30 DIAGNOSIS — E66811 Obesity, class 1: Secondary | ICD-10-CM | POA: Diagnosis not present

## 2024-12-30 LAB — POCT GLYCOSYLATED HEMOGLOBIN (HGB A1C): Hemoglobin A1C: 6 % — AB (ref 4.0–5.6)

## 2024-12-30 MED ORDER — TESTOSTERONE CYPIONATE 200 MG/ML IM SOLN: 200.0000 mg | INTRAMUSCULAR | 0 refills | Status: DC

## 2024-12-30 MED ORDER — DOXYCYCLINE HYCLATE 100 MG PO TABS: 100.0000 mg | ORAL_TABLET | Freq: Two times a day (BID) | ORAL | 0 refills | Status: AC

## 2024-12-30 NOTE — Patient Instructions (Signed)
 Please go get your xrays done at [facility]. You do not need to make an appointment. You can just show up.   Address: [address] [place], [place], [place] [address]

## 2024-12-30 NOTE — Progress Notes (Signed)
" ° °  Name: Ronald Anderson   Date of Visit: 12/30/2024   Date of last visit with me: 10/16/2024   CHIEF COMPLAINT:  Chief Complaint  Patient presents with   Follow-up    10 week follow up on mounjaro  and A1c, has some bug bite on the back of arm, red, hot, swollen. Thinks he could have broken a knuckle.        HPI:  Discussed the use of AI scribe software for clinical note transcription with the patient, who gave verbal consent to proceed.  History of Present Illness   Ronald Anderson is a 39 year old male with diabetes who presents for follow-up and management of his diabetes and other health concerns.  He has swelling in his arm with an unclear etiology. No recent snake bites or contact with grass.  His diabetes management is under review, with an A1c of 6.0, indicating good control. He has lost less than ten pounds and is on a medication regimen that he started last week, with the second dose scheduled for today. He notes a significant reduction in food cravings when injecting the medication into his abdominal fat, as opposed to his thighs.  He mentions a swollen knuckle on his hand from punching a hard object over a month ago. The swelling in his knuckle has persisted for over a month after punching a hard object.  He is preparing for back surgery in two weeks and has been experiencing knee pain. Despite this, he has been engaging in cycling with his children, which has not affected his back but is limited by knee discomfort.         OBJECTIVE:       10/16/2024    9:02 AM  Depression screen PHQ 2/9  Decreased Interest 0  Down, Depressed, Hopeless 0  PHQ - 2 Score 0     BP Readings from Last 3 Encounters:  12/30/24 130/84  10/16/24 126/82  09/17/24 122/78    BP 130/84   Pulse 78   Wt 241 lb 3.2 oz (109.4 kg)   SpO2 95%   BMI 32.71 kg/m    Physical Exam   EXTREMITIES: Swelling of the arm. Insect bite on the dorsal side of the left elbow. Noted swelling of the left  knuckles, mild TTP      Physical Exam  ASSESSMENT/PLAN:   Assessment & Plan Hypogonadism in male  Obesity (BMI 30.0-34.9)  Insect bite of left upper arm, initial encounter  Pain of left hand    Assessment and Plan    Insect bite of left upper arm Swelling likely due to insect bite. - Prescribed short course of antibiotics.  Pain in left hand Swelling in left hand knuckle post-trauma. - Ordered x-ray of left hand at [facility].  Obesity, class 1 A1c improved to 6.0. Weight loss less than 10 pounds. Current medication aiding weight loss and reduced cravings. - Continue current medication regimen. - Encouraged continued weight loss efforts.  Hypogonadism in male Steroid injections administered by wife. Requires refill. - Sent prescription refill for steroid injections.         Elise Knobloch A. Vita MD [facility] "

## 2025-01-11 ENCOUNTER — Encounter (HOSPITAL_COMMUNITY): Payer: Self-pay

## 2025-01-11 NOTE — Pre-Procedure Instructions (Signed)
 Surgical Instructions   Your procedure is scheduled on January 14, 2025. Report to [facility] Main Entrance A at 08:00 A.M., then check in with the Admitting office. Any questions or running late day of surgery: call [phone]  Questions prior to your surgery date: call [phone], Monday-Friday, 8am-4pm. If you experience any cold or flu symptoms such as cough, fever, chills, shortness of breath, etc. between now and your scheduled surgery, please notify us  at the above number.     Remember:  Do not eat after midnight the night before your surgery  You may drink clear liquids until 07:00 the morning of your surgery.   Clear liquids allowed are: Water , Non-Citrus Juices (without pulp), Carbonated Beverages, Clear Tea (no milk, honey, etc.), Black Coffee Only (NO MILK, CREAM OR POWDERED CREAMER of any kind), and Gatorade.  Patient Instructions  The night before surgery:  No food after midnight. ONLY clear liquids after midnight   The day of surgery (if you have diabetes): Drink ONE (1) 12 oz G2 given to you in your pre admission testing appointment by 07:00 the morning of surgery. Drink in one sitting. Do not sip.  This drink was given to you during your hospital  pre-op appointment visit.  Nothing else to drink after completing the  12 oz bottle of G2.         If you have questions, please contact your surgeons office.     Take these medicines the morning of surgery with A SIP OF WATER   atorvastatin  (LIPITOR)  Budeson-Glycopyrrol-Formoterol  (BREZTRI  AEROSPHERE  omeprazole  (PRILOSEC  OTC)  PARoxetine  (PAXIL )   May take these medicines IF NEEDED: albuterol  (VENTOLIN  HFA) 108 (90 Base)    One week prior to surgery, STOP taking any Aspirin (unless otherwise instructed by your surgeon) Aleve, Naproxen, Ibuprofen, Motrin, Advil, Goody's, BC's, all herbal medications, fish oil, and non-prescription vitamins.     WHAT DO I DO ABOUT MY DIABETES MEDICATION?   Stop taking  your tirzepatide  (MOUNJARO )  one week prior to surgery. Your last dose should be prior to January 06, 2025.       HOW TO MANAGE YOUR DIABETES BEFORE AND AFTER SURGERY  Why is it important to control my blood sugar before and after surgery? Improving blood sugar levels before and after surgery helps healing and can limit problems. A way of improving blood sugar control is eating a healthy diet by:  Eating less sugar and carbohydrates  Increasing activity/exercise  Talking with your doctor about reaching your blood sugar goals High blood sugars (greater than 180 mg/dL) can raise your risk of infections and slow your recovery, so you will need to focus on controlling your diabetes during the weeks before surgery. Make sure that the doctor who takes care of your diabetes knows about your planned surgery including the date and location.  How do I manage my blood sugar before surgery? Check your blood sugar at least 4 times a day, starting 2 days before surgery, to make sure that the level is not too high or low.  Check your blood sugar the morning of your surgery when you wake up and every 2 hours until you get to the Short Stay unit.  If your blood sugar is less than 70 mg/dL, you will need to treat for low blood sugar: Do not take insulin . Treat a low blood sugar (less than 70 mg/dL) with  cup of clear juice (cranberry or apple), 4 glucose tablets, OR glucose gel. Recheck blood sugar in 15  minutes after treatment (to make sure it is greater than 70 mg/dL). If your blood sugar is not greater than 70 mg/dL on recheck, call [phone] for further instructions. Report your blood sugar to the short stay nurse when you get to Short Stay.  If you are admitted to the hospital after surgery: Your blood sugar will be checked by the staff and you will probably be given insulin  after surgery (instead of oral diabetes medicines) to make sure you have good blood sugar levels. The goal for blood sugar  control after surgery is 80-180 mg/dL.                    Do NOT Smoke (Tobacco/Vaping) for 24 hours prior to your procedure.  If you use a CPAP at night, you may bring your mask/headgear for your overnight stay.   You will be asked to remove any contacts, glasses, piercing's, hearing aid's, dentures/partials prior to surgery. Please bring cases for these items if needed.    Your surgeon will determine if you are to be admitted or discharged the same day.  Patients discharged the day of surgery will not be allowed to drive home, and someone needs to stay with them for 24 hours.  SURGICAL WAITING ROOM VISITATION Patients may have no more than 2 support people in the waiting area - these visitors may rotate.   Pre-op nurse will coordinate an appropriate time for 2 ADULT support persons, who may not rotate, to accompany patient in pre-op.  Children under the age of 52 must have an adult with them who is not the patient and must remain in the main waiting area with an adult.  If the patient needs to stay at the hospital during part of their recovery, the visitor guidelines for inpatient rooms apply.  Please refer to the [facility] website for the visitor guidelines for any additional information.   If you received a COVID test during your pre-op visit  it is requested that you wear a mask when out in public, stay away from anyone that may not be feeling well and notify your surgeon if you develop symptoms. If you have been in contact with anyone that has tested positive in the last 10 days please notify you surgeon.      Pre-operative 4 CHG Bathing Instructions   You can play a key role in reducing the risk of infection after surgery. Your skin needs to be as free of germs as possible. You can reduce the number of germs on your skin by washing with CHG (chlorhexidine  gluconate) soap before surgery. CHG is an antiseptic soap that kills germs and continues to kill germs even after washing.    DO NOT use if you have an allergy to chlorhexidine /CHG or antibacterial soaps. If your skin becomes reddened or irritated, stop using the CHG and notify one of our RNs at [phone].   Please shower with the CHG soap starting 4 days before surgery using the following schedule:     Please keep in mind the following:  DO NOT shave, including legs and underarms, starting the day of your first shower.   You may shave your face at any point before/day of surgery.  Place clean sheets on your bed the day you start using CHG soap. Use a clean washcloth (not used since being washed) for each shower. DO NOT sleep with pets once you start using the CHG.   CHG Shower Instructions:  Oncologist and private  area with normal soap. If you choose to wash your hair, wash first with your normal shampoo.  After you use shampoo/soap, rinse your hair and body thoroughly to remove shampoo/soap residue.  Turn the water  OFF and apply  bottle of CHG soap to a CLEAN washcloth.  Apply CHG soap ONLY FROM YOUR NECK DOWN TO YOUR TOES (washing for 3-5 minutes)  DO NOT use CHG soap on face, private areas, open wounds, or sores.  Pay special attention to the area where your surgery is being performed.  If you are having back surgery, having someone wash your back for you may be helpful. Wait 2 minutes after CHG soap is applied, then you may rinse off the CHG soap.  Pat dry with a clean towel  Put on clean clothes/pajamas   If you choose to wear lotion, please use ONLY the CHG-compatible lotions that are listed below.  Additional instructions for the day of surgery:  If you choose, you may shower the morning of surgery with an antibacterial soap.  DO NOT APPLY any lotions, deodorants, cologne, or perfumes.   Do not bring valuables to the hospital. [facility] is not responsible for any belongings/valuables. Do not wear nail polish, gel polish, artificial nails, or any other type of covering on natural nails  (fingers and toes) Do not wear jewelry or makeup Put on clean/comfortable clothes.  Please brush your teeth.  Ask your nurse before applying any prescription medications to the skin.     CHG Compatible Lotions   Aveeno Moisturizing lotion  Cetaphil Moisturizing Cream  Cetaphil Moisturizing Lotion  Clairol Herbal Essence Moisturizing Lotion, Dry Skin  Clairol Herbal Essence Moisturizing Lotion, Extra Dry Skin  Clairol Herbal Essence Moisturizing Lotion, Normal Skin  Curel Age Defying Therapeutic Moisturizing Lotion with Alpha Hydroxy  Curel Extreme Care Body Lotion  Curel Soothing Hands Moisturizing Hand Lotion  Curel Therapeutic Moisturizing Cream, Fragrance-Free  Curel Therapeutic Moisturizing Lotion, Fragrance-Free  Curel Therapeutic Moisturizing Lotion, Original Formula  Eucerin Daily Replenishing Lotion  Eucerin Dry Skin Therapy Plus Alpha Hydroxy Crme  Eucerin Dry Skin Therapy Plus Alpha Hydroxy Lotion  Eucerin Original Crme  Eucerin Original Lotion  Eucerin Plus Crme Eucerin Plus Lotion  Eucerin TriLipid Replenishing Lotion  Keri Anti-Bacterial Hand Lotion  Keri Deep Conditioning Original Lotion Dry Skin Formula Softly Scented  Keri Deep Conditioning Original Lotion, Fragrance Free Sensitive Skin Formula  Keri Lotion Fast Absorbing Fragrance Free Sensitive Skin Formula  Keri Lotion Fast Absorbing Softly Scented Dry Skin Formula  Keri Original Lotion  Keri Skin Renewal Lotion Keri Silky Smooth Lotion  Keri Silky Smooth Sensitive Skin Lotion  Nivea Body Creamy Conditioning Oil  Nivea Body Extra Enriched Lotion  Nivea Body Original Lotion  Nivea Body Sheer Moisturizing Lotion Nivea Crme  Nivea Skin Firming Lotion  NutraDerm 30 Skin Lotion  NutraDerm Skin Lotion  NutraDerm Therapeutic Skin Cream  NutraDerm Therapeutic Skin Lotion  ProShield Protective Hand Cream  Provon moisturizing lotion  Please read over the following fact sheets that you were given.

## 2025-01-12 ENCOUNTER — Inpatient Hospital Stay (HOSPITAL_COMMUNITY)
Admission: RE | Admit: 2025-01-12 | Discharge: 2025-01-12 | Disposition: A | Source: Ambulatory Visit | Attending: Specialist | Admitting: Specialist

## 2025-01-12 ENCOUNTER — Ambulatory Visit: Payer: Self-pay | Admitting: Orthopedic Surgery

## 2025-01-12 ENCOUNTER — Other Ambulatory Visit: Payer: Self-pay

## 2025-01-12 ENCOUNTER — Ambulatory Visit (HOSPITAL_COMMUNITY)
Admission: RE | Admit: 2025-01-12 | Discharge: 2025-01-12 | Disposition: A | Discharge status: Home / Self Care | Source: Ambulatory Visit | Attending: Orthopedic Surgery | Admitting: Orthopedic Surgery

## 2025-01-12 ENCOUNTER — Encounter (HOSPITAL_COMMUNITY): Payer: Self-pay

## 2025-01-12 VITALS — BP 127/96 | HR 93 | Temp 98.1°F | Resp 18 | Ht 72.0 in | Wt 244.0 lb

## 2025-01-12 DIAGNOSIS — M48062 Spinal stenosis, lumbar region with neurogenic claudication: Secondary | ICD-10-CM

## 2025-01-12 DIAGNOSIS — Z01818 Encounter for other preprocedural examination: Secondary | ICD-10-CM

## 2025-01-12 HISTORY — DX: Peripheral vascular disease, unspecified: I73.9

## 2025-01-12 HISTORY — DX: Sleep apnea, unspecified: G47.30

## 2025-01-12 HISTORY — DX: Attention-deficit hyperactivity disorder, unspecified type: F90.9

## 2025-01-12 HISTORY — DX: Anxiety disorder, unspecified: F41.9

## 2025-01-12 HISTORY — DX: Cardiac arrhythmia, unspecified: I49.9

## 2025-01-12 HISTORY — DX: Type 2 diabetes mellitus without complications: E11.9

## 2025-01-12 LAB — BASIC METABOLIC PANEL WITH GFR
Anion gap: 10 (ref 5–15)
BUN: 15 mg/dL (ref 6–20)
CO2: 28 mmol/L (ref 22–32)
Calcium: 9.8 mg/dL (ref 8.9–10.3)
Chloride: 99 mmol/L (ref 98–111)
Creatinine, Ser: 1.27 mg/dL — ABNORMAL HIGH (ref 0.61–1.24)
GFR, Estimated: >60 mL/min
Glucose, Bld: 97 mg/dL (ref 70–99)
Potassium: 3.9 mmol/L (ref 3.5–5.1)
Sodium: 137 mmol/L (ref 135–145)

## 2025-01-12 LAB — CBC
HCT: 50 % (ref 39.0–52.0)
Hemoglobin: 17.6 g/dL — ABNORMAL HIGH (ref 13.0–17.0)
MCH: 32.1 pg (ref 26.0–34.0)
MCHC: 35.2 g/dL (ref 30.0–36.0)
MCV: 91.1 fL (ref 80.0–100.0)
Platelets: 188 K/uL (ref 150–400)
RBC: 5.49 MIL/uL (ref 4.22–5.81)
RDW: 12 % (ref 11.5–15.5)
WBC: 8.5 K/uL (ref 4.0–10.5)
nRBC: 0 % (ref 0.0–0.2)

## 2025-01-12 LAB — GLUCOSE, CAPILLARY
Glucose-Capillary: 118 mg/dL — ABNORMAL HIGH (ref 70–99)
Glucose-Capillary: 63 mg/dL — ABNORMAL LOW (ref 70–99)

## 2025-01-12 NOTE — Progress Notes (Addendum)
 PCP - Reyne Bustle Cardiologist - Miriam Shams, NP (cardiac clearance 12/15/24)  PPM/ICD - denies  Chest x-ray - 06/15/23 EKG - 02/25/24 Stress Test - denies ECHO - 01/21/20 Cardiac Cath - denies  Sleep Study - 08/16/21 CPAP - has one but doesn't wear it. Instructed to bring to hospital DOS.  Fasting Blood Sugar - doesn't check sugar at home   Last dose of GLP1 agonist-  01/06/25 GLP1 instructions: last dose to 01/06/25 Glucose 63 in PAT, beverage given and increased to 118 on recheck.   Blood Thinner Instructions: denies Aspirin Instructions: denies  ERAS Protcol - Clear liquids until 07:00 PRE-SURGERY G2-   COVID TEST- NA   Anesthesia review: yes, cardiac clearance; abnormal hgb.  Patient denies shortness of breath, fever, cough and chest pain at PAT appointment. Pt endorses chronic mild congestion, instructed to call if symptoms worsen.    All instructions explained to the patient, with a verbal understanding of the material. Patient agrees to go over the instructions while at home for a better understanding. The opportunity to ask questions was provided.

## 2025-01-12 NOTE — H&P (View-Only) (Signed)
 Ronald Anderson is an 39 y.o. male.   Chief Complaint: back and leg pain HPI: Reason for Visit: Diagnositc Results (lumbar MRI) Context: flared up x 1 week Location (Lower Extremity): lower back pain Severity: pain level 6/10 Associated Symptoms: numbness/tingling Medications: oxycodone  5mg  Notes: The patient had an ESI midline L5-S1 this morning. Last episode of incontinence when he is laying on his stomach the end of November. He has not had any since. He has bilateral leg pain and on the left the outer aspect of the leg otherwise bilaterally difficult standing difficult walking.  Past Medical History:  Diagnosis Date   Allergic rhinitis    Asthma    Diabetes mellitus without complication (HCC)    Dysrhythmia    Dysthymia    GERD (gastroesophageal reflux disease)    Hypertension     Past Surgical History:  Procedure Laterality Date   WISDOM TOOTH EXTRACTION      Family History  Problem Relation Age of Onset   Heart disease Mother    Stroke Father    Heart attack Father 52   Hypertension Father    Social History:  reports that he has never smoked. He has never used smokeless tobacco. He reports current alcohol use. He reports that he does not use drugs.  Allergies: Allergies[1]  Current meds: albuterol  sulfate HFA 90 mcg/actuation aerosol inhaler atorvastatin  10 mg tablet Breztri  Aerosphere 160 mcg-24mcg-4.8mcg/actuation HFA aerosol inhaler chlorhexidine  gluconate 4 % topical liquid gabapentin  600 mg tablet montelukast  10 mg tablet mupirocin 2 % topical ointment oxyCODONE  5 mg tablet PARoxetine  20 mg tablet predniSONE  10 mg tablets in a dose pack testosterone  cypionate 200 mg/mL intramuscular oil traZODone  valsartan  80 mg tablet ZyrTEC  Review of Systems  Constitutional: Negative.   HENT: Negative.    Eyes: Negative.   Respiratory: Negative.    Cardiovascular: Negative.   Gastrointestinal: Negative.   Endocrine: Negative.   Genitourinary: Negative.    Musculoskeletal:  Positive for back pain and gait problem.  Neurological:  Positive for weakness and numbness.  Psychiatric/Behavioral: Negative.      There were no vitals taken for this visit. Physical Exam Constitutional:      Appearance: Normal appearance.  HENT:     Head: Normocephalic and atraumatic.     Right Ear: External ear normal.     Left Ear: External ear normal.     Nose: Nose normal.     Mouth/Throat:     Pharynx: Oropharynx is clear.  Eyes:     Conjunctiva/sclera: Conjunctivae normal.  Cardiovascular:     Rate and Rhythm: Normal rate.     Pulses: Normal pulses.  Pulmonary:     Effort: Pulmonary effort is normal.  Abdominal:     General: Abdomen is flat.  Musculoskeletal:     Cervical back: Normal range of motion.     Comments: Gait and Station: Appearance: ambulating with no assistive devices and antalgic gait.  Constitutional: General Appearance: healthy-appearing and distress (mild).  Psychiatric: Mood and Affect: active and alert.  Cardiovascular System: Edema Right: none; Dorsalis and posterior tibial pulses 2+. Edema Left: none.  Abdomen: Inspection and Palpation: non-distended and no tenderness.  Skin: Inspection and palpation: no rash.  Lumbar Spine: Inspection: normal alignment. Bony Palpation of the Lumbar Spine: tender at lumbosacral junction.. Bony Palpation of the Right Hip: no tenderness of the greater trochanter and tenderness of the SI joint; Pelvis stable. Bony Palpation of the Left Hip: no tenderness of the greater trochanter and tenderness of the  SI joint. Soft Tissue Palpation on the Right: No flank pain with percussion. Active Range of Motion: limited flexion and extention.  Motor Strength: L1 Motor Strength on the Right: hip flexion iliopsoas 5/5. L1 Motor Strength on the Left: hip flexion iliopsoas 5/5. L2-L4 Motor Strength on the Right: knee extension quadriceps 5/5. L2-L4 Motor Strength on the Left: knee extension quadriceps 5/5. L5  Motor Strength on the Right: ankle dorsiflexion tibialis anterior 5/5 and great toe extension extensor hallucis longus 5/5. L5 Motor Strength on the Left: ankle dorsiflexion tibialis anterior 5/5 and great toe extension extensor hallucis longus 5/5. S1 Motor Strength on the Right: plantar flexion gastrocnemius 5/5. S1 Motor Strength on the Left: plantar flexion gastrocnemius 5/5.  Neurological System: Knee Reflex Right: normal (2). Knee Reflex Left: normal (2). Ankle Reflex Right: normal (2). Ankle Reflex Left: normal (2). Babinski Reflex Right: plantar reflex absent. Babinski Reflex Left: plantar reflex absent. Sensation on the Right: normal distal extremities. Sensation on the Left: normal distal extremities. Special Tests on the Right: no clonus of the ankle/knee. Special Tests on the Left: no clonus of the ankle/knee and seated straight leg raising test positive.  Skin:    General: Skin is warm and dry.  Neurological:     Mental Status: He is alert.    MRI lumbar spine demonstrates persistent epidural lipomatosis with prominent epidural fat severely effacing the thecal sac. Paracentral disc herniation L4-5 to the left effacing the left L5 nerve root. With displacement. With decreased circumferential epidural fat.  Assessment/Plan Impression:  1. 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 3 separate episodes of incontinence last at the end of November 2. 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  Hemilaminotomy and microdiscectomy at L4-5 left.  I had an extensive discussion with the patient concerning the pathology relevant anatomy and treatment  options. At this point exhausting conservative treatment and in the presence of a neurologic deficit we discussed microlumbar decompression. I discussed the risks and benefits including bleeding, infection, DVT, PE, anesthetic complications, worsening in their symptoms, improvement in their symptoms, C SF leakage, epidural fibrosis, need for future surgeries such as revision discectomy and lumbar fusion. I also indicated that this is an operation to basically decompress the nerve roots to allow recovery as opposed to fixing a herniated disc if it is encountered and that the incidence of recurrent chest disc herniation can approach 15%. Also that nerve root recovery is variable and may not recover completely. Any ligament or bone that is contributing to compressing the nerves will be removed as well.  I discussed the operative course including overnight in the hospital. Immediate ambulation. Follow-up in 2 weeks for suture removal. 6 weeks until healing of the herniation and surgical incision followed by 6 weeks of reconditioning and strengthening of the core musculature. Also discussed the need to employ the concepts of disc pressure management and core motion following the surgery to minimize the risk of recurrent disc herniation. We will obtain preoperative clearance i if necessary and proceed accordingly.  A MRSA contact within the family preoperative decolonization. Vancomycin , Kefzol  aspirin after 5 days. Oxycodone .  At this point in time out of work he is unable to  stand upright walk any long distance. A time without significant weakness and giving way in his legs. The patient is to call or present to the emergency room if there is any numbness or weakness to suggest worsening of their condition. In addition although rare if there is loss of bowel or bladder function such as incontinence or inability to void that this may represent a cauda equina syndrome. If noted the patient is to present immediately  to the emergency room for evaluation and treatment otherwise permanent bowel or bladder dysfunction can occur as a result.  Plan bilateral hemilaminotomy L5-S1, L4-5, left microdiscectomy L4-5  Ronald CHRISTELLA Randy, PA-C for Dr Duwayne 01/12/2025, 9:50 AM       [1]  Allergies Allergen Reactions   Amlodipine  Swelling   Penicillins Hives

## 2025-01-12 NOTE — H&P (Signed)
 Ronald Anderson is an 39 y.o. male.   Chief Complaint: back and leg pain HPI: Reason for Visit: Diagnositc Results (lumbar MRI) Context: flared up x 1 week Location (Lower Extremity): lower back pain Severity: pain level 6/10 Associated Symptoms: numbness/tingling Medications: oxycodone  5mg  Notes: The patient had an ESI midline L5-S1 this morning. Last episode of incontinence when he is laying on his stomach the end of November. He has not had any since. He has bilateral leg pain and on the left the outer aspect of the leg otherwise bilaterally difficult standing difficult walking.  Past Medical History:  Diagnosis Date   Allergic rhinitis    Asthma    Diabetes mellitus without complication (HCC)    Dysrhythmia    Dysthymia    GERD (gastroesophageal reflux disease)    Hypertension     Past Surgical History:  Procedure Laterality Date   WISDOM TOOTH EXTRACTION      Family History  Problem Relation Age of Onset   Heart disease Mother    Stroke Father    Heart attack Father 52   Hypertension Father    Social History:  reports that he has never smoked. He has never used smokeless tobacco. He reports current alcohol use. He reports that he does not use drugs.  Allergies: Allergies[1]  Current meds: albuterol  sulfate HFA 90 mcg/actuation aerosol inhaler atorvastatin  10 mg tablet Breztri  Aerosphere 160 mcg-24mcg-4.8mcg/actuation HFA aerosol inhaler chlorhexidine  gluconate 4 % topical liquid gabapentin  600 mg tablet montelukast  10 mg tablet mupirocin 2 % topical ointment oxyCODONE  5 mg tablet PARoxetine  20 mg tablet predniSONE  10 mg tablets in a dose pack testosterone  cypionate 200 mg/mL intramuscular oil traZODone  valsartan  80 mg tablet ZyrTEC  Review of Systems  Constitutional: Negative.   HENT: Negative.    Eyes: Negative.   Respiratory: Negative.    Cardiovascular: Negative.   Gastrointestinal: Negative.   Endocrine: Negative.   Genitourinary: Negative.    Musculoskeletal:  Positive for back pain and gait problem.  Neurological:  Positive for weakness and numbness.  Psychiatric/Behavioral: Negative.      There were no vitals taken for this visit. Physical Exam Constitutional:      Appearance: Normal appearance.  HENT:     Head: Normocephalic and atraumatic.     Right Ear: External ear normal.     Left Ear: External ear normal.     Nose: Nose normal.     Mouth/Throat:     Pharynx: Oropharynx is clear.  Eyes:     Conjunctiva/sclera: Conjunctivae normal.  Cardiovascular:     Rate and Rhythm: Normal rate.     Pulses: Normal pulses.  Pulmonary:     Effort: Pulmonary effort is normal.  Abdominal:     General: Abdomen is flat.  Musculoskeletal:     Cervical back: Normal range of motion.     Comments: Gait and Station: Appearance: ambulating with no assistive devices and antalgic gait.  Constitutional: General Appearance: healthy-appearing and distress (mild).  Psychiatric: Mood and Affect: active and alert.  Cardiovascular System: Edema Right: none; Dorsalis and posterior tibial pulses 2+. Edema Left: none.  Abdomen: Inspection and Palpation: non-distended and no tenderness.  Skin: Inspection and palpation: no rash.  Lumbar Spine: Inspection: normal alignment. Bony Palpation of the Lumbar Spine: tender at lumbosacral junction.. Bony Palpation of the Right Hip: no tenderness of the greater trochanter and tenderness of the SI joint; Pelvis stable. Bony Palpation of the Left Hip: no tenderness of the greater trochanter and tenderness of the  SI joint. Soft Tissue Palpation on the Right: No flank pain with percussion. Active Range of Motion: limited flexion and extention.  Motor Strength: L1 Motor Strength on the Right: hip flexion iliopsoas 5/5. L1 Motor Strength on the Left: hip flexion iliopsoas 5/5. L2-L4 Motor Strength on the Right: knee extension quadriceps 5/5. L2-L4 Motor Strength on the Left: knee extension quadriceps 5/5. L5  Motor Strength on the Right: ankle dorsiflexion tibialis anterior 5/5 and great toe extension extensor hallucis longus 5/5. L5 Motor Strength on the Left: ankle dorsiflexion tibialis anterior 5/5 and great toe extension extensor hallucis longus 5/5. S1 Motor Strength on the Right: plantar flexion gastrocnemius 5/5. S1 Motor Strength on the Left: plantar flexion gastrocnemius 5/5.  Neurological System: Knee Reflex Right: normal (2). Knee Reflex Left: normal (2). Ankle Reflex Right: normal (2). Ankle Reflex Left: normal (2). Babinski Reflex Right: plantar reflex absent. Babinski Reflex Left: plantar reflex absent. Sensation on the Right: normal distal extremities. Sensation on the Left: normal distal extremities. Special Tests on the Right: no clonus of the ankle/knee. Special Tests on the Left: no clonus of the ankle/knee and seated straight leg raising test positive.  Skin:    General: Skin is warm and dry.  Neurological:     Mental Status: He is alert.    MRI lumbar spine demonstrates persistent epidural lipomatosis with prominent epidural fat severely effacing the thecal sac. Paracentral disc herniation L4-5 to the left effacing the left L5 nerve root. With displacement. With decreased circumferential epidural fat.  Assessment/Plan Impression:  1. 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 3 separate episodes of incontinence last at the end of November 2. 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  Hemilaminotomy and microdiscectomy at L4-5 left.  I had an extensive discussion with the patient concerning the pathology relevant anatomy and treatment  options. At this point exhausting conservative treatment and in the presence of a neurologic deficit we discussed microlumbar decompression. I discussed the risks and benefits including bleeding, infection, DVT, PE, anesthetic complications, worsening in their symptoms, improvement in their symptoms, C SF leakage, epidural fibrosis, need for future surgeries such as revision discectomy and lumbar fusion. I also indicated that this is an operation to basically decompress the nerve roots to allow recovery as opposed to fixing a herniated disc if it is encountered and that the incidence of recurrent chest disc herniation can approach 15%. Also that nerve root recovery is variable and may not recover completely. Any ligament or bone that is contributing to compressing the nerves will be removed as well.  I discussed the operative course including overnight in the hospital. Immediate ambulation. Follow-up in 2 weeks for suture removal. 6 weeks until healing of the herniation and surgical incision followed by 6 weeks of reconditioning and strengthening of the core musculature. Also discussed the need to employ the concepts of disc pressure management and core motion following the surgery to minimize the risk of recurrent disc herniation. We will obtain preoperative clearance i if necessary and proceed accordingly.  A MRSA contact within the family preoperative decolonization. Vancomycin , Kefzol  aspirin after 5 days. Oxycodone .  At this point in time out of work he is unable to  stand upright walk any long distance. A time without significant weakness and giving way in his legs. The patient is to call or present to the emergency room if there is any numbness or weakness to suggest worsening of their condition. In addition although rare if there is loss of bowel or bladder function such as incontinence or inability to void that this may represent a cauda equina syndrome. If noted the patient is to present immediately  to the emergency room for evaluation and treatment otherwise permanent bowel or bladder dysfunction can occur as a result.  Plan bilateral hemilaminotomy L5-S1, L4-5, left microdiscectomy L4-5  Ronald CHRISTELLA Randy, PA-C for Dr Duwayne 01/12/2025, 9:50 AM       [1]  Allergies Allergen Reactions   Amlodipine  Swelling   Penicillins Hives

## 2025-01-13 NOTE — Progress Notes (Signed)
 Anesthesia Chart Review:  39 year old male follows with cardiology for history of HTN, HLD, ascending aortic aneurysm.  Calcium  scoring 07/25/2023 showed calcium  score of 0, ascending aortic aneurysm, 4.4 cm.  Echo 12/2019 showed LVEF 60%, normal wall motion.  Seen by Miriam Shams, NP on 12/15/2024 for preop evaluation.  Per note, According to the Revised Cardiac Risk Index (RCRI), his Perioperative Risk of Major Cardiac Event is (%): 0.4 His Functional Capacity in METs is: 5.07 according to the Duke Activity Status Index (DASI). Therefore, based on ACC/AHA guidelines, patient would be at acceptable risk for the planned procedure without further cardiovascular testing. I will route this recommendation to the requesting party via Epic fax function. The patient was advised that if he develops new symptoms prior to surgery to contact our office to arrange for a follow-up visit, and he verbalized understanding.   Other pertinent history includes asthma, GERD on PPI, non-insulin -dependent DM2 (A1c 6.0 on 12/30/2024), anxiety, ADHD, OSA noncompliant with CPAP.  Patient reports last dose GLP-1 agonist 01/06/2025.  Preop labs reviewed, creatinine mildly elevated 1.27, otherwise unremarkable.  EKG 02/25/2024: NSR.  Rate 81.  Possible LAE.  Echocardiogram 01/21/2020: Normal LV systolic function with EF 60%. Left ventricle cavity is normal in size. Normal global wall motion. Normal diastolic filling pattern. Calculated EF 60%.      Ronald Anderson, Ronald Anderson [facility] Short Stay Center/Anesthesiology Phone [phone] 01/13/2025 9:50 AM

## 2025-01-13 NOTE — Anesthesia Preprocedure Evaluation (Signed)
"                                    Anesthesia Evaluation  Patient identified by MRN, date of birth, ID band Patient awake    Reviewed: Allergy & Precautions, NPO status , Patient's Chart, lab work & pertinent test results, reviewed documented beta blocker date and time   History of Anesthesia Complications Negative for: history of anesthetic complications  Airway Mallampati: III  TM Distance: >3 FB     Dental no notable dental hx.    Pulmonary asthma , sleep apnea and Continuous Positive Airway Pressure Ventilation    breath sounds clear to auscultation       Cardiovascular hypertension, (-) angina + Peripheral Vascular Disease  (-) CAD and (-) Past MI  Rhythm:Regular Rate:Normal     Neuro/Psych neg Seizures PSYCHIATRIC DISORDERS Anxiety Depression       GI/Hepatic ,GERD  ,,(+) neg Cirrhosis        Endo/Other  diabetes    Renal/GU CRFRenal disease     Musculoskeletal   Abdominal   Peds  Hematology   Anesthesia Other Findings   Reproductive/Obstetrics                              Anesthesia Physical Anesthesia Plan  ASA: 2  Anesthesia Plan: General   Post-op Pain Management:    Induction: Intravenous  PONV Risk Score and Plan: 1 and Ondansetron  and Dexamethasone   Airway Management Planned: Oral ETT  Additional Equipment:   Intra-op Plan:   Post-operative Plan: Extubation in OR  Informed Consent: I have reviewed the patients History and Physical, chart, labs and discussed the procedure including the risks, benefits and alternatives for the proposed anesthesia with the patient or authorized representative who has indicated his/her understanding and acceptance.     Dental advisory given  Plan Discussed with: CRNA  Anesthesia Plan Comments: (PAT note by Lynwood Hope, PA-C: 39 year old male follows with cardiology for history of HTN, HLD, ascending aortic aneurysm.  Calcium  scoring 07/25/2023 showed calcium   score of 0, ascending aortic aneurysm, 4.4 cm.  Echo 12/2019 showed LVEF 60%, normal wall motion.  Seen by Miriam Shams, NP on 12/15/2024 for preop evaluation.  Per note, According to the Revised Cardiac Risk Index (RCRI), his Perioperative Risk of Major Cardiac Event is (%): 0.4 His Functional Capacity in METs is: 5.07 according to the Duke Activity Status Index (DASI). Therefore, based on ACC/AHA guidelines, patient would be at acceptable risk for the planned procedure without further cardiovascular testing. I will route this recommendation to the requesting party via Epic fax function. The patient was advised that if he develops new symptoms prior to surgery to contact our office to arrange for a follow-up visit, and he verbalized understanding.   Other pertinent history includes asthma, GERD on PPI, non-insulin -dependent DM2 (A1c 6.0 on 12/30/2024), anxiety, ADHD, OSA noncompliant with CPAP.  Patient reports last dose GLP-1 agonist 01/06/2025.  Preop labs reviewed, creatinine mildly elevated 1.27, otherwise unremarkable.  EKG 02/25/2024: NSR.  Rate 81.  Possible LAE.  Echocardiogram 01/21/2020: Normal LV systolic function with EF 60%. Left ventricle cavity is normal in size. Normal global wall motion. Normal diastolic filling pattern. Calculated EF 60%.   )         Anesthesia Quick Evaluation  "

## 2025-01-13 NOTE — Progress Notes (Signed)
 Patient was called to be informed that his surgery time for tomorrow was changed. Patient or his wife not available. This writer left a message and instructed patient to be at the hospital at 09:00 and to drink G2 at 08:00.

## 2025-01-14 ENCOUNTER — Other Ambulatory Visit: Payer: Self-pay

## 2025-01-14 ENCOUNTER — Ambulatory Visit (HOSPITAL_COMMUNITY)
Admission: RE | Admit: 2025-01-14 | Discharge: 2025-01-14 | Disposition: A | Discharge status: Home / Self Care | Attending: Specialist | Admitting: Specialist

## 2025-01-14 ENCOUNTER — Ambulatory Visit (HOSPITAL_COMMUNITY): Admitting: Certified Registered Nurse Anesthetist

## 2025-01-14 ENCOUNTER — Ambulatory Visit (HOSPITAL_COMMUNITY)

## 2025-01-14 ENCOUNTER — Encounter (HOSPITAL_COMMUNITY)
Admission: RE | Disposition: A | Discharge status: Home / Self Care | Payer: Self-pay | Source: Home / Self Care | Attending: Specialist

## 2025-01-14 ENCOUNTER — Ambulatory Visit (HOSPITAL_COMMUNITY): Admitting: Physician Assistant

## 2025-01-14 ENCOUNTER — Encounter (HOSPITAL_COMMUNITY): Payer: Self-pay | Admitting: Specialist

## 2025-01-14 DIAGNOSIS — F419 Anxiety disorder, unspecified: Secondary | ICD-10-CM | POA: Diagnosis not present

## 2025-01-14 DIAGNOSIS — J45909 Unspecified asthma, uncomplicated: Secondary | ICD-10-CM | POA: Diagnosis not present

## 2025-01-14 DIAGNOSIS — M5116 Intervertebral disc disorders with radiculopathy, lumbar region: Secondary | ICD-10-CM | POA: Diagnosis not present

## 2025-01-14 DIAGNOSIS — M48061 Spinal stenosis, lumbar region without neurogenic claudication: Secondary | ICD-10-CM | POA: Diagnosis present

## 2025-01-14 DIAGNOSIS — G4730 Sleep apnea, unspecified: Secondary | ICD-10-CM | POA: Insufficient documentation

## 2025-01-14 DIAGNOSIS — E119 Type 2 diabetes mellitus without complications: Secondary | ICD-10-CM | POA: Insufficient documentation

## 2025-01-14 DIAGNOSIS — I10 Essential (primary) hypertension: Secondary | ICD-10-CM | POA: Insufficient documentation

## 2025-01-14 DIAGNOSIS — E882 Lipomatosis, not elsewhere classified: Secondary | ICD-10-CM | POA: Diagnosis not present

## 2025-01-14 DIAGNOSIS — M48062 Spinal stenosis, lumbar region with neurogenic claudication: Secondary | ICD-10-CM | POA: Insufficient documentation

## 2025-01-14 DIAGNOSIS — M4807 Spinal stenosis, lumbosacral region: Secondary | ICD-10-CM | POA: Diagnosis present

## 2025-01-14 DIAGNOSIS — F32A Depression, unspecified: Secondary | ICD-10-CM | POA: Insufficient documentation

## 2025-01-14 DIAGNOSIS — M4726 Other spondylosis with radiculopathy, lumbar region: Secondary | ICD-10-CM | POA: Diagnosis not present

## 2025-01-14 DIAGNOSIS — Z01818 Encounter for other preprocedural examination: Secondary | ICD-10-CM

## 2025-01-14 DIAGNOSIS — M5416 Radiculopathy, lumbar region: Secondary | ICD-10-CM | POA: Diagnosis present

## 2025-01-14 LAB — GLUCOSE, CAPILLARY
Glucose-Capillary: 91 mg/dL (ref 70–99)
Glucose-Capillary: 118 mg/dL — ABNORMAL HIGH (ref 70–99)

## 2025-01-14 LAB — SURGICAL PCR SCREEN
MRSA, PCR: NEGATIVE
Staphylococcus aureus: NEGATIVE

## 2025-01-14 MED ORDER — CEFAZOLIN SODIUM-DEXTROSE 2-4 GM/100ML-% IV SOLN
2.0000 g | INTRAVENOUS | Status: AC
  Administered 2025-01-14: 2 g via INTRAVENOUS

## 2025-01-14 MED ORDER — FENTANYL CITRATE (PF) 250 MCG/5ML IJ SOLN
INTRAMUSCULAR | Status: AC
  Filled 2025-01-14: qty 5

## 2025-01-14 MED ORDER — BUDESON-GLYCOPYRROL-FORMOTEROL 160-9-4.8 MCG/ACT IN AERO
2.0000 | INHALATION_SPRAY | Freq: Two times a day (BID) | RESPIRATORY_TRACT | Status: DC
  Filled 2025-01-14: qty 5.9

## 2025-01-14 MED ORDER — POLYETHYLENE GLYCOL 3350 17 G PO PACK: 17.0000 g | PACK | Freq: Every day | ORAL | Status: DC

## 2025-01-14 MED ORDER — PROPOFOL 10 MG/ML IV BOLUS
INTRAVENOUS | Status: DC | PRN
  Administered 2025-01-14: 200 mg via INTRAVENOUS

## 2025-01-14 MED ORDER — CHLORHEXIDINE GLUCONATE 0.12 % MT SOLN
15.0000 mL | Freq: Once | OROMUCOSAL | Status: AC
  Administered 2025-01-14: 15 mL via OROMUCOSAL

## 2025-01-14 MED ORDER — BUPIVACAINE-EPINEPHRINE 0.5% -1:200000 IJ SOLN
INTRAMUSCULAR | Status: DC | PRN
  Administered 2025-01-14: 5 mL

## 2025-01-14 MED ORDER — LACTATED RINGERS IV SOLN: INTRAVENOUS | Status: DC | PRN

## 2025-01-14 MED ORDER — POLYETHYLENE GLYCOL 3350 17 G PO PACK: 17.0000 g | PACK | Freq: Every day | ORAL | 0 refills | Status: AC

## 2025-01-14 MED ORDER — TRANEXAMIC ACID-NACL 1000-0.7 MG/100ML-% IV SOLN
1000.0000 mg | INTRAVENOUS | Status: AC
  Administered 2025-01-14: 1000 mg via INTRAVENOUS

## 2025-01-14 MED ORDER — ONDANSETRON HCL 4 MG/2ML IJ SOLN: 4.0000 mg | Freq: Four times a day (QID) | INTRAMUSCULAR | Status: DC | PRN

## 2025-01-14 MED ORDER — ORAL CARE MOUTH RINSE: 15.0000 mL | Freq: Once | OROMUCOSAL | Status: AC

## 2025-01-14 MED ORDER — FENTANYL CITRATE (PF) 250 MCG/5ML IJ SOLN
INTRAMUSCULAR | Status: DC | PRN
  Administered 2025-01-14: 100 ug via INTRAVENOUS
  Administered 2025-01-14: 50 ug via INTRAVENOUS

## 2025-01-14 MED ORDER — ROCURONIUM BROMIDE 10 MG/ML (PF) SYRINGE
PREFILLED_SYRINGE | INTRAVENOUS | Status: AC
  Filled 2025-01-14: qty 20

## 2025-01-14 MED ORDER — METHOCARBAMOL 500 MG PO TABS: 500.0000 mg | ORAL_TABLET | Freq: Four times a day (QID) | ORAL | Status: DC | PRN

## 2025-01-14 MED ORDER — THROMBIN 20000 UNITS EX SOLR
CUTANEOUS | Status: DC | PRN
  Administered 2025-01-14: 20 mL via TOPICAL

## 2025-01-14 MED ORDER — LACTATED RINGERS IV SOLN: INTRAVENOUS | Status: DC

## 2025-01-14 MED ORDER — GABAPENTIN 300 MG PO CAPS: 600.0000 mg | ORAL_CAPSULE | Freq: Every day | ORAL | Status: DC

## 2025-01-14 MED ORDER — EPHEDRINE SULFATE-NACL 50-0.9 MG/10ML-% IV SOSY
PREFILLED_SYRINGE | INTRAVENOUS | Status: DC | PRN
  Administered 2025-01-14: 10 mg via INTRAVENOUS
  Administered 2025-01-14: 5 mg via INTRAVENOUS

## 2025-01-14 MED ORDER — RISAQUAD PO CAPS
1.0000 | ORAL_CAPSULE | Freq: Every day | ORAL | Status: DC
  Administered 2025-01-14: 1 via ORAL
  Filled 2025-01-14: qty 1

## 2025-01-14 MED ORDER — MAGNESIUM CITRATE PO SOLN: 1.0000 | Freq: Once | ORAL | Status: DC | PRN

## 2025-01-14 MED ORDER — ALUM & MAG HYDROXIDE-SIMETH 200-200-20 MG/5ML PO SUSP: 30.0000 mL | Freq: Four times a day (QID) | ORAL | Status: DC | PRN

## 2025-01-14 MED ORDER — THROMBIN 20000 UNITS EX SOLR
CUTANEOUS | Status: AC
  Filled 2025-01-14: qty 20000

## 2025-01-14 MED ORDER — IRBESARTAN 150 MG PO TABS
300.0000 mg | ORAL_TABLET | Freq: Every day | ORAL | Status: DC
  Filled 2025-01-14: qty 2

## 2025-01-14 MED ORDER — INSULIN ASPART 100 UNIT/ML IJ SOLN: 0.0000 [IU] | INTRAMUSCULAR | Status: DC | PRN

## 2025-01-14 MED ORDER — DOCUSATE SODIUM 100 MG PO CAPS
100.0000 mg | ORAL_CAPSULE | Freq: Two times a day (BID) | ORAL | Status: DC
  Administered 2025-01-14: 100 mg via ORAL
  Filled 2025-01-14: qty 1

## 2025-01-14 MED ORDER — ALBUTEROL SULFATE HFA 108 (90 BASE) MCG/ACT IN AERS
INHALATION_SPRAY | RESPIRATORY_TRACT | Status: DC | PRN
  Administered 2025-01-14: 2 via RESPIRATORY_TRACT

## 2025-01-14 MED ORDER — BISACODYL 5 MG PO TBEC: 5.0000 mg | DELAYED_RELEASE_TABLET | Freq: Every day | ORAL | Status: DC | PRN

## 2025-01-14 MED ORDER — ALBUTEROL SULFATE (2.5 MG/3ML) 0.083% IN NEBU: 3.0000 mL | INHALATION_SOLUTION | Freq: Four times a day (QID) | RESPIRATORY_TRACT | Status: DC

## 2025-01-14 MED ORDER — PAROXETINE HCL 30 MG PO TABS
30.0000 mg | ORAL_TABLET | Freq: Every day | ORAL | Status: DC
  Filled 2025-01-14: qty 1

## 2025-01-14 MED ORDER — PHENOL 1.4 % MT LIQD: 1.0000 | OROMUCOSAL | Status: DC | PRN

## 2025-01-14 MED ORDER — VANCOMYCIN HCL 1500 MG/300ML IV SOLN
1500.0000 mg | INTRAVENOUS | Status: AC
  Administered 2025-01-14: 1500 mg via INTRAVENOUS
  Filled 2025-01-14: qty 300

## 2025-01-14 MED ORDER — PROPOFOL 10 MG/ML IV BOLUS
INTRAVENOUS | Status: AC
  Filled 2025-01-14: qty 20

## 2025-01-14 MED ORDER — OXYCODONE HCL 5 MG PO TABS: 5.0000 mg | ORAL_TABLET | Freq: Once | ORAL | Status: DC | PRN

## 2025-01-14 MED ORDER — MIDAZOLAM HCL 2 MG/2ML IJ SOLN
INTRAMUSCULAR | Status: AC
  Filled 2025-01-14: qty 2

## 2025-01-14 MED ORDER — STERILE WATER FOR IRRIGATION IR SOLN
Status: DC | PRN
  Administered 2025-01-14: 1000 mL

## 2025-01-14 MED ORDER — OXYCODONE HCL 5 MG PO TABS: 5.0000 mg | ORAL_TABLET | ORAL | 0 refills | Status: AC | PRN

## 2025-01-14 MED ORDER — DEXAMETHASONE SOD PHOSPHATE PF 10 MG/ML IJ SOLN
INTRAMUSCULAR | Status: AC
  Filled 2025-01-14: qty 2

## 2025-01-14 MED ORDER — SUGAMMADEX SODIUM 200 MG/2ML IV SOLN
INTRAVENOUS | Status: DC | PRN
  Administered 2025-01-14: 200 mg via INTRAVENOUS
  Administered 2025-01-14: 100 mg via INTRAVENOUS

## 2025-01-14 MED ORDER — ATORVASTATIN CALCIUM 10 MG PO TABS
20.0000 mg | ORAL_TABLET | Freq: Every day | ORAL | Status: DC
  Filled 2025-01-14: qty 2

## 2025-01-14 MED ORDER — OXYCODONE HCL 5 MG PO TABS: 5.0000 mg | ORAL_TABLET | ORAL | Status: DC | PRN

## 2025-01-14 MED ORDER — CEFAZOLIN SODIUM-DEXTROSE 2-4 GM/100ML-% IV SOLN
INTRAVENOUS | Status: AC
  Filled 2025-01-14: qty 100

## 2025-01-14 MED ORDER — LIDOCAINE 2% (20 MG/ML) 5 ML SYRINGE
INTRAMUSCULAR | Status: AC
  Filled 2025-01-14: qty 10

## 2025-01-14 MED ORDER — TRAZODONE HCL 50 MG PO TABS: 150.0000 mg | ORAL_TABLET | Freq: Every day | ORAL | Status: DC

## 2025-01-14 MED ORDER — CHLORTHALIDONE 25 MG PO TABS
25.0000 mg | ORAL_TABLET | Freq: Every day | ORAL | Status: DC
  Filled 2025-01-14: qty 1

## 2025-01-14 MED ORDER — ONDANSETRON HCL 4 MG/2ML IJ SOLN
INTRAMUSCULAR | Status: DC | PRN
  Administered 2025-01-14: 4 mg via INTRAVENOUS

## 2025-01-14 MED ORDER — CEFAZOLIN SODIUM-DEXTROSE 2-4 GM/100ML-% IV SOLN
2.0000 g | Freq: Three times a day (TID) | INTRAVENOUS | Status: DC
  Administered 2025-01-14: 2 g via INTRAVENOUS
  Filled 2025-01-14: qty 100

## 2025-01-14 MED ORDER — ALBUMIN HUMAN 5 % IV SOLN: INTRAVENOUS | Status: DC | PRN

## 2025-01-14 MED ORDER — OXYCODONE HCL 5 MG PO TABS
10.0000 mg | ORAL_TABLET | ORAL | Status: DC | PRN
  Administered 2025-01-14: 10 mg via ORAL
  Filled 2025-01-14: qty 2

## 2025-01-14 MED ORDER — ACETAMINOPHEN 10 MG/ML IV SOLN
INTRAVENOUS | Status: AC
  Filled 2025-01-14: qty 100

## 2025-01-14 MED ORDER — ACETAMINOPHEN 325 MG PO TABS: 650.0000 mg | ORAL_TABLET | ORAL | Status: DC | PRN

## 2025-01-14 MED ORDER — ONDANSETRON HCL 4 MG PO TABS: 4.0000 mg | ORAL_TABLET | Freq: Four times a day (QID) | ORAL | Status: DC | PRN

## 2025-01-14 MED ORDER — ROCURONIUM BROMIDE 10 MG/ML (PF) SYRINGE
PREFILLED_SYRINGE | INTRAVENOUS | Status: DC | PRN
  Administered 2025-01-14 (×3): 10 mg via INTRAVENOUS
  Administered 2025-01-14: 90 mg via INTRAVENOUS

## 2025-01-14 MED ORDER — HYDROMORPHONE HCL 1 MG/ML IJ SOLN: 0.5000 mg | INTRAMUSCULAR | Status: DC | PRN

## 2025-01-14 MED ORDER — ACETAMINOPHEN 10 MG/ML IV SOLN: 1000.0000 mg | Freq: Once | INTRAVENOUS | Status: DC | PRN

## 2025-01-14 MED ORDER — DEXAMETHASONE SOD PHOSPHATE PF 10 MG/ML IJ SOLN
INTRAMUSCULAR | Status: DC | PRN
  Administered 2025-01-14: 4 mg via INTRAVENOUS

## 2025-01-14 MED ORDER — TRANEXAMIC ACID-NACL 1000-0.7 MG/100ML-% IV SOLN
INTRAVENOUS | Status: AC
  Filled 2025-01-14: qty 100

## 2025-01-14 MED ORDER — ACETAMINOPHEN 10 MG/ML IV SOLN
1000.0000 mg | INTRAVENOUS | Status: AC
  Administered 2025-01-14: 1000 mg via INTRAVENOUS

## 2025-01-14 MED ORDER — OXYCODONE HCL 5 MG/5ML PO SOLN: 5.0000 mg | Freq: Once | ORAL | Status: DC | PRN

## 2025-01-14 MED ORDER — BUPIVACAINE-EPINEPHRINE (PF) 0.5% -1:200000 IJ SOLN
INTRAMUSCULAR | Status: AC
  Filled 2025-01-14: qty 30

## 2025-01-14 MED ORDER — FENTANYL CITRATE (PF) 100 MCG/2ML IJ SOLN: 25.0000 ug | INTRAMUSCULAR | Status: DC | PRN

## 2025-01-14 MED ORDER — METHOCARBAMOL 1000 MG/10ML IJ SOLN: 500.0000 mg | Freq: Four times a day (QID) | INTRAMUSCULAR | Status: DC | PRN

## 2025-01-14 MED ORDER — LORATADINE 10 MG PO TABS: 5.0000 mg | ORAL_TABLET | Freq: Every evening | ORAL | Status: DC

## 2025-01-14 MED ORDER — OMEPRAZOLE 20 MG PO CPDR
20.0000 mg | DELAYED_RELEASE_CAPSULE | Freq: Two times a day (BID) | ORAL | Status: DC
  Filled 2025-01-14 (×2): qty 1

## 2025-01-14 MED ORDER — ONDANSETRON HCL 4 MG/2ML IJ SOLN
INTRAMUSCULAR | Status: AC
  Filled 2025-01-14: qty 2

## 2025-01-14 MED ORDER — PHENYLEPHRINE HCL-NACL 20-0.9 MG/250ML-% IV SOLN
INTRAVENOUS | Status: DC | PRN
  Administered 2025-01-14: 40 ug/min via INTRAVENOUS

## 2025-01-14 MED ORDER — MIDAZOLAM HCL 5 MG/5ML IJ SOLN
INTRAMUSCULAR | Status: DC | PRN
  Administered 2025-01-14: 2 mg via INTRAVENOUS

## 2025-01-14 MED ORDER — KCL IN DEXTROSE-NACL 20-5-0.9 MEQ/L-%-% IV SOLN
INTRAVENOUS | Status: DC
  Filled 2025-01-14: qty 1000

## 2025-01-14 MED ORDER — PHENYLEPHRINE 80 MCG/ML (10ML) SYRINGE FOR IV PUSH (FOR BLOOD PRESSURE SUPPORT)
PREFILLED_SYRINGE | INTRAVENOUS | Status: DC | PRN
  Administered 2025-01-14 (×3): 160 ug via INTRAVENOUS
  Administered 2025-01-14: 80 ug via INTRAVENOUS

## 2025-01-14 MED ORDER — DOCUSATE SODIUM 100 MG PO CAPS: 100.0000 mg | ORAL_CAPSULE | Freq: Two times a day (BID) | ORAL | 2 refills | Status: AC

## 2025-01-14 MED ORDER — MENTHOL 3 MG MT LOZG: 1.0000 | LOZENGE | OROMUCOSAL | Status: DC | PRN

## 2025-01-14 MED ORDER — MONTELUKAST SODIUM 10 MG PO TABS: 10.0000 mg | ORAL_TABLET | Freq: Every day | ORAL | Status: DC

## 2025-01-14 MED ORDER — ONDANSETRON HCL 4 MG/2ML IJ SOLN: 4.0000 mg | Freq: Once | INTRAMUSCULAR | Status: DC | PRN

## 2025-01-14 MED ORDER — 0.9 % SODIUM CHLORIDE (POUR BTL) OPTIME
TOPICAL | Status: DC | PRN
  Administered 2025-01-14: 1000 mL

## 2025-01-14 MED ORDER — LIDOCAINE 2% (20 MG/ML) 5 ML SYRINGE
INTRAMUSCULAR | Status: DC | PRN
  Administered 2025-01-14: 100 mg via INTRAVENOUS

## 2025-01-14 MED ORDER — METHOCARBAMOL 1000 MG/10ML IJ SOLN
INTRAMUSCULAR | Status: DC | PRN
  Administered 2025-01-14: 1000 mg via INTRAVENOUS

## 2025-01-14 MED ORDER — ACETAMINOPHEN 650 MG RE SUPP: 650.0000 mg | RECTAL | Status: DC | PRN

## 2025-01-14 NOTE — Evaluation (Signed)
 Occupational Therapy Evaluation Patient Details Name: Ronald Anderson MRN: [id number] DOB: 1986-09-03 Today's Date: 01/14/2025   History of Present Illness   39 yo M adm 01/15/24 for Bilateral hemilaminotomy L5-S1 with excision of epidural lipomatosis bilaterally.     Clinical Impressions Patient admitted for the procedure above.  PTA he lives at home with his spouse, works full time and remains independent with all ADL,iADL, and mobility.  Patient is essentially at his baseline for ADL completion, hallway mobility, and was able to climb a flight of stairs independently.  No further OT needs exist in the acute setting.  Patient with good understanding of all precautions.  Recommend follow up as prescribed by MD.       If plan is discharge home, recommend the following:   Assist for transportation     Functional Status Assessment   Patient has not had a recent decline in their functional status     Equipment Recommendations   None recommended by OT     Recommendations for Other Services         Precautions/Restrictions   Precautions Precautions: Back Precaution Booklet Issued: Yes (comment) Recall of Precautions/Restrictions: Intact Restrictions Weight Bearing Restrictions Per Provider Order: No     Mobility Bed Mobility Overal bed mobility: Independent                  Transfers Overall transfer level: Independent                        Balance Overall balance assessment: No apparent balance deficits (not formally assessed)                                         ADL either performed or assessed with clinical judgement   ADL Overall ADL's : At baseline                                             Vision Patient Visual Report: No change from baseline       Perception Perception: Not tested       Praxis Praxis: Not tested       Pertinent Vitals/Pain Pain Assessment Pain Assessment:  Faces Faces Pain Scale: Hurts little more Pain Location: Incisional Pain Descriptors / Indicators: Sore Pain Intervention(s): Monitored during session     Extremity/Trunk Assessment Upper Extremity Assessment Upper Extremity Assessment: Overall WFL for tasks assessed   Lower Extremity Assessment Lower Extremity Assessment: Overall WFL for tasks assessed   Cervical / Trunk Assessment Cervical / Trunk Assessment: Back Surgery   Communication Communication Communication: No apparent difficulties   Cognition Arousal: Alert Behavior During Therapy: WFL for tasks assessed/performed Cognition: No apparent impairments                               Following commands: Intact       Cueing  General Comments   Cueing Techniques: Verbal cues   VSS on RA   Exercises     Shoulder Instructions      Home Living Family/patient expects to be discharged to:: Private residence Living Arrangements: Spouse/significant other;Parent;Other relatives Available Help at Discharge: Family;Available 24 hours/day Type of Home: House Home Access: Stairs  to enter Entrance Stairs-Number of Steps: 3 Entrance Stairs-Rails: Left;Right Home Layout: One level     Bathroom Shower/Tub: Tub/shower unit;Walk-in shower   Bathroom Toilet: Standard Bathroom Accessibility: Yes How Accessible: Accessible via walker Home Equipment: None          Prior Functioning/Environment Prior Level of Function : Independent/Modified Independent;Working/employed;Driving                    OT Problem List: Pain   OT Treatment/Interventions:        OT Goals(Current goals can be found in the care plan section)   Acute Rehab OT Goals Patient Stated Goal: Return home OT Goal Formulation: With patient Time For Goal Achievement: 01/21/25 Potential to Achieve Goals: Good   OT Frequency:       Co-evaluation              AM-PAC OT 6 Clicks Daily Activity     Outcome Measure  Help from another person eating meals?: None Help from another person taking care of personal grooming?: None Help from another person toileting, which includes using toliet, bedpan, or urinal?: None Help from another person bathing (including washing, rinsing, drying)?: None Help from another person to put on and taking off regular upper body clothing?: None Help from another person to put on and taking off regular lower body clothing?: None 6 Click Score: 24   End of Session Nurse Communication: Mobility status  Activity Tolerance: Patient tolerated treatment well Patient left: in chair;with call bell/phone within reach  OT Visit Diagnosis: Unsteadiness on feet (R26.81)                Time: 1450-1511 OT Time Calculation (min): 21 min Charges:  OT General Charges $OT Visit: 1 Visit OT Evaluation $OT Eval Moderate Complexity: 1 Mod  01/14/2025  RP, OTR/L  Acute Rehabilitation Services  Office:  [phone]   Charlie JONETTA Halsted 01/14/2025, 3:16 PM

## 2025-01-14 NOTE — Anesthesia Procedure Notes (Signed)
 Procedure Name: Intubation Date/Time: 01/14/2025 10:24 AM  Performed by: Lettie Derrek Dacosta, CRNAPre-anesthesia Checklist: Patient identified, Patient being monitored, Timeout performed, Emergency Drugs available and Suction available Patient Re-evaluated:Patient Re-evaluated prior to induction Oxygen Delivery Method: Circle System Utilized Preoxygenation: Pre-oxygenation with 100% oxygen Induction Type: IV induction Ventilation: Mask ventilation without difficulty Laryngoscope Size: Mac and 3 Grade View: Grade I Tube type: Oral Tube size: 7.0 mm Number of attempts: 1 Airway Equipment and Method: Stylet Placement Confirmation: ETT inserted through vocal cords under direct vision, positive ETCO2 and breath sounds checked- equal and bilateral Secured at: 21 cm Tube secured with: Tape Dental Injury: Teeth and Oropharynx as per pre-operative assessment

## 2025-01-14 NOTE — Interval H&P Note (Signed)
 History and Physical Interval Note:  01/14/2025 10:08 AM  [name]  has presented today for surgery, with the diagnosis of spinal stenosis of lumbar region bilateral.  The various methods of treatment have been discussed with the patient and family. After consideration of risks, benefits and other options for treatment, the patient has consented to  Procedures with comments: DECOMPRESSIVE LUMBAR LAMINECTOMY LEVEL 1 (Bilateral) - BIL HEMILAMINOTOMY L5-S1, L4-5  MICRODICECTOMY L4-5 LEFT as a surgical intervention.  The patient's history has been reviewed, patient examined, no change in status, stable for surgery.  I have reviewed the patient's chart and labs.  Questions were answered to the patient's satisfaction.    Patient has left lower extremity radicular pain.  Bilateral numbness in his legs and weakness when he ambulates.  He has to sit.  He has severe spinal stenosis and he will 5 S1 secondary epidural lipomatosis and left lower extremity radicular pain secondary to disc herniation L4-5 left.  He has neurotension signs on the left.  Slight EHL weakness.  Has had 3 episodes of incontinence when last seen in the office. Today no incontinence.  Able to completely void.  Given his history though would not recommend delaying surgery further.   Reyes JAYSON Billing

## 2025-01-14 NOTE — Transfer of Care (Signed)
 Immediate Anesthesia Transfer of Care Note  Patient: Ronald Anderson  Procedure(s) Performed: BILATERAL HEMILAMINOTOMY LUMBAR FIVE - SACRAL ONE, LUMBAR FOUR - LUMBAR FIVE, MICRODISCECTOMY LUMBAR FOUR TO FIVE LEFT (Bilateral: Spine Lumbar)  Patient Location: PACU  Anesthesia Type:General  Level of Consciousness: awake, alert , patient cooperative, and responds to stimulation  Airway & Oxygen Therapy: Patient Spontanous Breathing and Patient connected to face mask oxygen  Post-op Assessment: Report given to RN and Post -op Vital signs reviewed and stable  Post vital signs: Reviewed and stable  Last Vitals:  Vitals Value Taken Time  BP 136/86 01/14/25 13:05  Temp    Pulse 106 01/14/25 13:07  Resp 14 01/14/25 13:07  SpO2 92 % 01/14/25 13:07  Vitals shown include unfiled device data.  Last Pain:  Vitals:   01/14/25 0949  TempSrc:   PainSc: 2          Complications: No notable events documented.

## 2025-01-14 NOTE — Plan of Care (Signed)
Pt doing well. Pt and wife given D/C instructions with verbal understanding. Rx's were sent to the pharmacy by MD. Pt's incision is clean and dry with no sign of infection. Pt's IV was removed prior to D/C. Pt D/C'd home via walking per MD order. Pt is stable @ D/C and has no other needs at this time. Holli Humbles, RN

## 2025-01-14 NOTE — Op Note (Unsigned)
 NAME: Ronald Anderson, Ronald Anderson MEDICAL RECORD NO: [id number] ACCOUNT NO: [id number] DATE OF BIRTH: 08-25-1986 FACILITY: [facility] LOCATION: [facility] PHYSICIAN: Reyes KYM Billing, MD  Operative Report   DATE OF PROCEDURE: 01/14/2025  PREOPERATIVE DIAGNOSES: 1. 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 2. Spinal stenosis, epidural lipomatosis, and herniated nucleus pulposus L4-L5 left.  POSTOPERATIVE DIAGNOSES: 1. 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 2. Spinal stenosis, epidural lipomatosis, and herniated nucleus pulposus L4-L5 left.  PROCEDURE PERFORMED: 1. Bilateral hemilaminotomy L5-S1 with excision of epidural lipomatosis bilaterally. 2. Hemilaminotomy L4-L5 left with microdiskectomy L4-L5 left and excision of epidural lipomatosis L4-L5 left.  ANESTHESIA:  General.  SURGEON:  Reyes KYM Billing, MD.  ASSISTANT:  Jaclyn Bissell, PA.  HISTORY:  This is a 38 year old male with bilateral lower extremity weakness and neurogenic claudication and left lower extremity radicular pain with severe compression of the thecal sac at L5-S1 due to epidural lipomatosis and a disk herniation L4-L5 to  the left.  He was indicated for hemilaminotomies and excision of the epidural lipomatosis at L5-S1 bilaterally and L4-L5 on the left and a microdiskectomy.  Risk and benefits discussed including bleeding, infection, damage to neurovascular structures,  no change in symptoms, worsening symptoms, DVT, PE, and anesthetic complications, etc.  DESCRIPTION OF PROCEDURE:  With the patient in the supine position, after induction of adequate general anesthesia, 2 grams of Kefzol , was placed prone on the Wilson frame.  All bony prominences were well padded.  Lumbar regions prepped and draped in  usual sterile fashion.  Two 18-gauge spinal needles were utilized to localize the L4-L5 and L5-S1 interspace, confirmed with x-ray.  Incision was made from the spinous process of L4 to below S1.   Subcutaneous tissue was dissected.  Electrocautery was  utilized to achieve hemostasis.  The dorsal lumbar fascia was divided in line with the skin incision on the left at L4-L5 and L5-S1 and on the right at L5-S1 through separate fascial incisions.  Paraspinous musculature was elevated from the lamina of L4,  L5, and S1 bilaterally, and a McCulloch retractor was placed, and operating microscope was draped and brought into the surgical field.  A straight curette was utilized to detach ligamentum flavum from the cephalad edge of S1.  I used a Penfield to  protect the neural elements and performed a hemilaminotomy of the cephalad edge of S1 to fully detach the ligamentum flavum.  This first on the left, and once detaching that, copious portions of the epidural adipose tissue exuded through the laminotomy  defect.  A Woodson probe was utilized to develop a plane between the epidural fat and the ligamentum flavum.  I removed ligamentum flavum from the interspace.  A significant epidural lipomatosis was encountered, and it was sequentially removed utilizing  a combination of suction sweeping with a Woodson as well as bayonets.  This beneath the thecal sac as well as ventrally, cephalad, and caudad and into the lateral recess.  We encountered an epidural venous plexus, was cauterized and divided as well.   There was no disk herniation noted.  We had good restoration of the thecal sac and the S1 nerve root and a Woodson probe passed freely out the foramen of L5 and S1.  In a similar fashion, we opened up the hemilamina on the right, removing ligamentum  flavum, protecting the neural elements while doing so and again, an exuberant epidural adipose tissue protruded through the hemilaminotomy defect.  This was then removed sequentially again with suction sweeping with a [facility]  as well as bayonets.   Beneath the thecal sac cephalad, caudad, ventrally and caudally.  No disk herniation was noted.  Electrocautery was  utilized to achieve hemostasis.  Good restoration of the thecal sac noted.  No CSF leakage or active bleeding on both sides.  Turned our  attention to the left.  With a McCulloch retractor, hemilaminotomy was performed at the caudad edge of L4.  To the point cephalad detaching the ligamentum flavum, a straight curette was utilized to detach ligamentum flavum from the cephalad edge of L5.   Protected the neural elements with a Woodson and a patty and removed ligamentum flavum from the interspace.  A moderate amount of epidural fat was encountered, and this was excised in a similar fashion.  I identified the L5 nerve root and gently  mobilized it medially, cauterizing the epidural venous plexus, identifying a focal HNP and I performed an annulotomy, and copious portions of disk material was removed from the disk space with a straight and upbiting pituitary, further mobilized with  catheter irrigation and additional fragments.  All herniated material was removed and I checked beneath the thecal sac out the foramen of L4 and out L5 under the shoulder of the L5 root and the axilla, there was no residual disk herniation or residual  epidural fat.  Bipolar electrocautery was utilized to achieve hemostasis.  Bone wax was placed on the cancellous surfaces.  Copiously irrigated once again.  Thrombin -soaked Gelfoam was placed in the laminotomy defects and then retrieved.  We revisited  all hemi-laminas, irrigated, and achieved strict hemostasis.  No active bleeding.  Good restoration of the thecal sac was noted.  Confirmatory radiograph obtained of the [facility] at L5-S1 and a Little just above the lamina of 5.  Next, irrigated the paraspinous musculature after removing the McCulloch retractor, and on both sides, meticulously achieved hemostasis through bipolar electrocautery.  We had decompressed the right side at L5-S1 from the other side of the operating room  table.  Following the decompression and the copious  irrigation, no active bleeding or CSF leakage.  We closed the dorsal lumbar fascia with 1 Vicryl in a figure-of-eight suture, subcu with 2, and skin with staples.  Wound was dressed sterilely.  Patient  supine on the hospital bed, extubated without difficulty, and transported to the recovery room in satisfactory condition.  Patient tolerated procedure well.  No complications.   Assistant Jaclyn Bissell, [place] was used throughout the case for patient positioning, general intermittent neural traction, suction, and closure.  BLOOD LOSS:  50 mL.   CHR D: 01/14/2025 1:17:51 pm T: 01/14/2025 1:44:00 pm  JOB: [id number]/ [id number]

## 2025-01-14 NOTE — Discharge Instructions (Signed)
 Walk As Tolerated utilizing back precautions.  No bending, twisting, or lifting.  No driving for 2 weeks.   Aquacel dressing may remain in place until follow up. May shower with aquacel dressing in place. If the dressing peels off or becomes saturated, you may remove aquacel dressing and place gauze and tape dressing which should be kept clean and dry and changed daily. Do not remove steri-strips if they are present. See Dr. Duwayne in office in 10 to 14 days. Begin taking aspirin 81mg  per day starting 4 days after your surgery if not allergic to aspirin or on another blood thinner. Walk daily even outside. Use a cane or walker only if necessary. Avoid sitting on soft sofas.

## 2025-01-14 NOTE — Brief Op Note (Addendum)
 01/14/2025 11:09 AM  10:10 AM  PATIENT:  Ronald Anderson  38 y.o. male  PRE-OPERATIVE DIAGNOSIS:  spinal stenosis of lumbar region bilateral  POST-OPERATIVE DIAGNOSIS:  *same  PROCEDURE:  Procedures with comments: DECOMPRESSIVE LUMBAR LAMINECTOMY LEVEL 1 (Bilateral) - BIL HEMILAMINOTOMY L5-S1, L4-5  MICRODICECTOMY L4-5 LEFT  SURGEON:  Surgeons and Role:    DEWAINE Duwayne Purchase, MD - Primary  PHYSICIAN ASSISTANT:   ASSISTANTS: Bissell   ANESTHESIA:   general  EBL:  50   BLOOD ADMINISTERED:none  DRAINS: none   LOCAL MEDICATIONS USED:  MARCAINE      SPECIMEN:  No Specimen  DISPOSITION OF SPECIMEN:  N/A  COUNTS:  YES  TOURNIQUET:  * No tourniquets in log *  DICTATION: .Other Dictation: Dictation Number [id number]  PLAN OF CARE: Admit for overnight observation  PATIENT DISPOSITION:  PACU - hemodynamically stable.   Delay start of Pharmacological VTE agent (>24hrs) due to surgical blood loss or risk of bleeding: yes

## 2025-01-14 NOTE — Interval H&P Note (Signed)
 History and Physical Interval Note:  01/14/2025 10:07 AM  Ronald Anderson  has presented today for surgery, with the diagnosis of spinal stenosis of lumbar region bilateral.  The various methods of treatment have been discussed with the patient and family. After consideration of risks, benefits and other options for treatment, the patient has consented to  Procedures with comments: DECOMPRESSIVE LUMBAR LAMINECTOMY LEVEL 1 (Bilateral) - BIL HEMILAMINOTOMY L5-S1, L4-5  MICRODICECTOMY L4-5 LEFT as a surgical intervention.  The patient's history has been reviewed, patient examined, no change in status, stable for surgery.  I have reviewed the patient's chart and labs.  Questions were answered to the patient's satisfaction.   Patient has bilateral leg weakness.  Particularly with ambulation.  Positive straight leg raise on the left.  Slight EHL weakness on the left. Patient has had 3 episodes of urinary incontinence when last seen.  Today he reports no incontinence.  No numbness in the perineal region.  He is able to completely empty.  We discussed proceeding with a decompression at L4-5 left and L5-S1 bilaterally. Given his history of incontinence I would not recommend delaying surgery further.  Ronald Anderson Billing

## 2025-01-15 ENCOUNTER — Other Ambulatory Visit: Payer: Self-pay | Admitting: Family Medicine

## 2025-01-15 ENCOUNTER — Encounter (HOSPITAL_COMMUNITY): Payer: Self-pay | Admitting: Specialist

## 2025-01-15 DIAGNOSIS — E66811 Obesity, class 1: Secondary | ICD-10-CM

## 2025-01-15 DIAGNOSIS — E291 Testicular hypofunction: Secondary | ICD-10-CM

## 2025-01-15 NOTE — Anesthesia Postprocedure Evaluation (Signed)
"   Anesthesia Post Note  Patient: Ronald Anderson  Procedure(s) Performed: BILATERAL HEMILAMINOTOMY LUMBAR FIVE - SACRAL ONE, LUMBAR FOUR - LUMBAR FIVE, MICRODISCECTOMY LUMBAR FOUR TO FIVE LEFT (Bilateral: Spine Lumbar)     Patient location during evaluation: PACU Anesthesia Type: General Level of consciousness: awake and alert Pain management: pain level controlled Vital Signs Assessment: post-procedure vital signs reviewed and stable Respiratory status: spontaneous breathing, nonlabored ventilation, respiratory function stable and patient connected to nasal cannula oxygen Cardiovascular status: blood pressure returned to baseline and stable Postop Assessment: no apparent nausea or vomiting Anesthetic complications: no   No notable events documented.  Last Vitals:  Vitals:   01/14/25 1427 01/14/25 1615  BP: (!) 117/95 135/86  Pulse: 98 (!) 103  Resp: 20 18  Temp: 36.4 C (!) 36.3 C  SpO2: 94% 95%    Last Pain:  Vitals:   01/14/25 1645  TempSrc:   PainSc: 3                  [name]      "

## 2025-01-21 ENCOUNTER — Other Ambulatory Visit: Payer: Self-pay | Admitting: Family Medicine

## 2025-03-31 ENCOUNTER — Ambulatory Visit: Admitting: Family Medicine
